# Patient Record
Sex: Male | Born: 1945 | Race: White | Hispanic: No | Marital: Married | Smoking: Former smoker
Health system: Southern US, Community
[De-identification: ages and names within clinical notes are randomized; demographics above are authoritative.]

## PROBLEM LIST (undated history)

## (undated) DIAGNOSIS — E119 Type 2 diabetes mellitus without complications: Secondary | ICD-10-CM

## (undated) DIAGNOSIS — E785 Hyperlipidemia, unspecified: Secondary | ICD-10-CM

## (undated) DIAGNOSIS — M109 Gout, unspecified: Secondary | ICD-10-CM

## (undated) DIAGNOSIS — R55 Syncope and collapse: Secondary | ICD-10-CM

## (undated) DIAGNOSIS — I10 Essential (primary) hypertension: Secondary | ICD-10-CM

---
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
## 2019-08-02 DIAGNOSIS — I10 Essential (primary) hypertension: Secondary | ICD-10-CM

## 2019-08-02 DIAGNOSIS — E785 Hyperlipidemia, unspecified: Secondary | ICD-10-CM | POA: Diagnosis not present

## 2019-08-02 DIAGNOSIS — S91011A Laceration without foreign body, right ankle, initial encounter: Secondary | ICD-10-CM

## 2019-08-02 DIAGNOSIS — N289 Disorder of kidney and ureter, unspecified: Secondary | ICD-10-CM

## 2019-08-02 DIAGNOSIS — S81812A Laceration without foreign body, left lower leg, initial encounter: Secondary | ICD-10-CM

## 2019-08-02 DIAGNOSIS — Z87891 Personal history of nicotine dependence: Secondary | ICD-10-CM

## 2019-08-02 DIAGNOSIS — R931 Abnormal findings on diagnostic imaging of heart and coronary circulation: Secondary | ICD-10-CM

## 2019-08-02 DIAGNOSIS — R9431 Abnormal electrocardiogram [ECG] [EKG]: Secondary | ICD-10-CM

## 2019-08-02 DIAGNOSIS — R634 Abnormal weight loss: Secondary | ICD-10-CM

## 2019-08-02 DIAGNOSIS — R55 Syncope and collapse: Secondary | ICD-10-CM

## 2019-08-02 DIAGNOSIS — R778 Other specified abnormalities of plasma proteins: Secondary | ICD-10-CM

## 2019-08-02 DIAGNOSIS — E119 Type 2 diabetes mellitus without complications: Secondary | ICD-10-CM | POA: Diagnosis not present

## 2019-08-03 ENCOUNTER — Encounter (HOSPITAL_COMMUNITY): Payer: Self-pay

## 2019-08-03 ENCOUNTER — Inpatient Hospital Stay (HOSPITAL_COMMUNITY): Payer: Medicare PPO

## 2019-08-03 ENCOUNTER — Other Ambulatory Visit: Payer: Self-pay

## 2019-08-03 ENCOUNTER — Inpatient Hospital Stay (HOSPITAL_COMMUNITY)
Admission: AD | Admit: 2019-08-03 | Discharge: 2019-08-12 | DRG: 233 | Disposition: A | Discharge status: Home / Self Care | Payer: Medicare PPO | Source: Other Acute Inpatient Hospital | Attending: Surgery | Admitting: Surgery

## 2019-08-03 ENCOUNTER — Encounter (HOSPITAL_COMMUNITY)
Admission: AD | Disposition: A | Discharge status: Home / Self Care | Payer: Self-pay | Source: Other Acute Inpatient Hospital | Attending: Surgery

## 2019-08-03 DIAGNOSIS — K219 Gastro-esophageal reflux disease without esophagitis: Secondary | ICD-10-CM | POA: Diagnosis present

## 2019-08-03 DIAGNOSIS — Y832 Surgical operation with anastomosis, bypass or graft as the cause of abnormal reaction of the patient, or of later complication, without mention of misadventure at the time of the procedure: Secondary | ICD-10-CM | POA: Diagnosis not present

## 2019-08-03 DIAGNOSIS — M109 Gout, unspecified: Secondary | ICD-10-CM | POA: Diagnosis present

## 2019-08-03 DIAGNOSIS — Z950 Presence of cardiac pacemaker: Secondary | ICD-10-CM

## 2019-08-03 DIAGNOSIS — H409 Unspecified glaucoma: Secondary | ICD-10-CM | POA: Diagnosis present

## 2019-08-03 DIAGNOSIS — I97190 Other postprocedural cardiac functional disturbances following cardiac surgery: Secondary | ICD-10-CM | POA: Diagnosis not present

## 2019-08-03 DIAGNOSIS — E1122 Type 2 diabetes mellitus with diabetic chronic kidney disease: Secondary | ICD-10-CM | POA: Diagnosis present

## 2019-08-03 DIAGNOSIS — I455 Other specified heart block: Secondary | ICD-10-CM | POA: Diagnosis not present

## 2019-08-03 DIAGNOSIS — N1832 Chronic kidney disease, stage 3b: Secondary | ICD-10-CM | POA: Diagnosis present

## 2019-08-03 DIAGNOSIS — E119 Type 2 diabetes mellitus without complications: Secondary | ICD-10-CM | POA: Diagnosis not present

## 2019-08-03 DIAGNOSIS — I469 Cardiac arrest, cause unspecified: Secondary | ICD-10-CM

## 2019-08-03 DIAGNOSIS — I129 Hypertensive chronic kidney disease with stage 1 through stage 4 chronic kidney disease, or unspecified chronic kidney disease: Secondary | ICD-10-CM | POA: Diagnosis present

## 2019-08-03 DIAGNOSIS — Q245 Malformation of coronary vessels: Secondary | ICD-10-CM | POA: Diagnosis not present

## 2019-08-03 DIAGNOSIS — I4901 Ventricular fibrillation: Secondary | ICD-10-CM | POA: Diagnosis not present

## 2019-08-03 DIAGNOSIS — I959 Hypotension, unspecified: Secondary | ICD-10-CM | POA: Diagnosis not present

## 2019-08-03 DIAGNOSIS — Z8249 Family history of ischemic heart disease and other diseases of the circulatory system: Secondary | ICD-10-CM

## 2019-08-03 DIAGNOSIS — Z09 Encounter for follow-up examination after completed treatment for conditions other than malignant neoplasm: Secondary | ICD-10-CM

## 2019-08-03 DIAGNOSIS — I2510 Atherosclerotic heart disease of native coronary artery without angina pectoris: Secondary | ICD-10-CM | POA: Diagnosis not present

## 2019-08-03 DIAGNOSIS — I480 Paroxysmal atrial fibrillation: Secondary | ICD-10-CM | POA: Diagnosis not present

## 2019-08-03 DIAGNOSIS — Z87891 Personal history of nicotine dependence: Secondary | ICD-10-CM | POA: Diagnosis not present

## 2019-08-03 DIAGNOSIS — I491 Atrial premature depolarization: Secondary | ICD-10-CM | POA: Diagnosis not present

## 2019-08-03 DIAGNOSIS — I462 Cardiac arrest due to underlying cardiac condition: Secondary | ICD-10-CM | POA: Diagnosis present

## 2019-08-03 DIAGNOSIS — D62 Acute posthemorrhagic anemia: Secondary | ICD-10-CM | POA: Diagnosis not present

## 2019-08-03 DIAGNOSIS — E785 Hyperlipidemia, unspecified: Secondary | ICD-10-CM | POA: Diagnosis present

## 2019-08-03 DIAGNOSIS — I252 Old myocardial infarction: Secondary | ICD-10-CM | POA: Diagnosis not present

## 2019-08-03 DIAGNOSIS — I495 Sick sinus syndrome: Secondary | ICD-10-CM | POA: Diagnosis not present

## 2019-08-03 DIAGNOSIS — R55 Syncope and collapse: Secondary | ICD-10-CM

## 2019-08-03 DIAGNOSIS — Z951 Presence of aortocoronary bypass graft: Secondary | ICD-10-CM | POA: Diagnosis not present

## 2019-08-03 DIAGNOSIS — I2119 ST elevation (STEMI) myocardial infarction involving other coronary artery of inferior wall: Secondary | ICD-10-CM | POA: Diagnosis not present

## 2019-08-03 DIAGNOSIS — I97710 Intraoperative cardiac arrest during cardiac surgery: Secondary | ICD-10-CM | POA: Diagnosis not present

## 2019-08-03 DIAGNOSIS — Z01810 Encounter for preprocedural cardiovascular examination: Secondary | ICD-10-CM | POA: Diagnosis not present

## 2019-08-03 DIAGNOSIS — I200 Unstable angina: Secondary | ICD-10-CM

## 2019-08-03 DIAGNOSIS — I10 Essential (primary) hypertension: Secondary | ICD-10-CM | POA: Diagnosis not present

## 2019-08-03 DIAGNOSIS — R634 Abnormal weight loss: Secondary | ICD-10-CM | POA: Diagnosis not present

## 2019-08-03 DIAGNOSIS — N289 Disorder of kidney and ureter, unspecified: Secondary | ICD-10-CM | POA: Diagnosis not present

## 2019-08-03 HISTORY — DX: Atherosclerotic heart disease of native coronary artery without angina pectoris: I25.10

## 2019-08-03 HISTORY — DX: Cardiac arrest, cause unspecified: I46.9

## 2019-08-03 HISTORY — DX: Type 2 diabetes mellitus without complications: E11.9

## 2019-08-03 HISTORY — DX: Hyperlipidemia, unspecified: E78.5

## 2019-08-03 HISTORY — PX: LEFT HEART CATH AND CORONARY ANGIOGRAPHY: CATH118249

## 2019-08-03 HISTORY — DX: Gout, unspecified: M10.9

## 2019-08-03 HISTORY — DX: Syncope and collapse: R55

## 2019-08-03 HISTORY — PX: TEMPORARY PACEMAKER: CATH118268

## 2019-08-03 HISTORY — DX: ST elevation (STEMI) myocardial infarction involving other coronary artery of inferior wall: I21.19

## 2019-08-03 HISTORY — DX: Essential (primary) hypertension: I10

## 2019-08-03 LAB — SURGICAL PCR SCREEN
MRSA, PCR: NEGATIVE
Staphylococcus aureus: NEGATIVE

## 2019-08-03 LAB — CBC
HCT: 28.3 % — ABNORMAL LOW (ref 39.0–52.0)
Hemoglobin: 9.7 g/dL — ABNORMAL LOW (ref 13.0–17.0)
MCH: 29.7 pg (ref 26.0–34.0)
MCHC: 34.3 g/dL (ref 30.0–36.0)
MCV: 86.5 fL (ref 80.0–100.0)
Platelets: 142 10*3/uL — ABNORMAL LOW (ref 150–400)
RBC: 3.27 MIL/uL — ABNORMAL LOW (ref 4.22–5.81)
RDW: 13.2 % (ref 11.5–15.5)
WBC: 7.3 10*3/uL (ref 4.0–10.5)
nRBC: 0 % (ref 0.0–0.2)

## 2019-08-03 LAB — CREATININE, SERUM
Creatinine, Ser: 1.82 mg/dL — ABNORMAL HIGH (ref 0.61–1.24)
GFR calc Af Amer: 42 mL/min — ABNORMAL LOW (ref >60)
GFR calc non Af Amer: 36 mL/min — ABNORMAL LOW (ref >60)

## 2019-08-03 LAB — ABO/RH: ABO/RH(D): A POS

## 2019-08-03 LAB — ECHOCARDIOGRAM COMPLETE
Height: 70 in
Weight: 2592 oz

## 2019-08-03 LAB — GLUCOSE, CAPILLARY
Glucose-Capillary: 110 mg/dL — ABNORMAL HIGH (ref 70–99)
Glucose-Capillary: 107 mg/dL — ABNORMAL HIGH (ref 70–99)
Glucose-Capillary: 127 mg/dL — ABNORMAL HIGH (ref 70–99)

## 2019-08-03 LAB — MRSA PCR SCREENING: MRSA by PCR: NEGATIVE

## 2019-08-03 SURGERY — LEFT HEART CATH AND CORONARY ANGIOGRAPHY
Anesthesia: LOCAL

## 2019-08-03 MED ORDER — SODIUM CHLORIDE 0.9 % IV SOLN
750.0000 mg | INTRAVENOUS | Status: DC
  Filled 2019-08-03: qty 750

## 2019-08-03 MED ORDER — DEXMEDETOMIDINE HCL IN NACL 400 MCG/100ML IV SOLN
0.1000 ug/kg/h | INTRAVENOUS | Status: AC
  Administered 2019-08-04: 0.7 ug/kg/h via INTRAVENOUS
  Filled 2019-08-03: qty 100

## 2019-08-03 MED ORDER — HEPARIN SODIUM (PORCINE) 1000 UNIT/ML IJ SOLN
INTRAMUSCULAR | Status: AC
  Filled 2019-08-03: qty 1

## 2019-08-03 MED ORDER — SODIUM CHLORIDE 0.9% FLUSH
3.0000 mL | Freq: Two times a day (BID) | INTRAVENOUS | Status: DC
  Administered 2019-08-03: 3 mL via INTRAVENOUS

## 2019-08-03 MED ORDER — OXYCODONE HCL 5 MG PO TABS
5.0000 mg | ORAL_TABLET | ORAL | Status: DC | PRN
  Administered 2019-08-03: 5 mg via ORAL
  Filled 2019-08-03: qty 1

## 2019-08-03 MED ORDER — LIDOCAINE HCL (PF) 1 % IJ SOLN
INTRAMUSCULAR | Status: DC | PRN
  Administered 2019-08-03: 15 mL
  Administered 2019-08-03: 2 mL

## 2019-08-03 MED ORDER — HEPARIN SODIUM (PORCINE) 1000 UNIT/ML IJ SOLN
INTRAMUSCULAR | Status: DC | PRN
  Administered 2019-08-03: 3500 [IU] via INTRAVENOUS

## 2019-08-03 MED ORDER — SODIUM CHLORIDE 0.9 % WEIGHT BASED INFUSION: 3.0000 mL/kg/h | INTRAVENOUS | Status: AC

## 2019-08-03 MED ORDER — LABETALOL HCL 5 MG/ML IV SOLN: 10.0000 mg | INTRAVENOUS | Status: DC | PRN

## 2019-08-03 MED ORDER — SODIUM CHLORIDE 0.9% FLUSH: 3.0000 mL | Freq: Two times a day (BID) | INTRAVENOUS | Status: DC

## 2019-08-03 MED ORDER — VERAPAMIL HCL 2.5 MG/ML IV SOLN
INTRAVENOUS | Status: DC | PRN
  Administered 2019-08-03: 10 mL via INTRA_ARTERIAL

## 2019-08-03 MED ORDER — HEPARIN (PORCINE) IN NACL 1000-0.9 UT/500ML-% IV SOLN
INTRAVENOUS | Status: DC | PRN
  Administered 2019-08-03 (×3): 500 mL

## 2019-08-03 MED ORDER — ACETAMINOPHEN 325 MG PO TABS: 650.0000 mg | ORAL_TABLET | ORAL | Status: DC | PRN

## 2019-08-03 MED ORDER — PLASMA-LYTE 148 IV SOLN
INTRAVENOUS | Status: AC
  Administered 2019-08-04: 500 mL
  Filled 2019-08-03: qty 2.5

## 2019-08-03 MED ORDER — POTASSIUM CHLORIDE 2 MEQ/ML IV SOLN
80.0000 meq | INTRAVENOUS | Status: DC
  Filled 2019-08-03: qty 40

## 2019-08-03 MED ORDER — HYDRALAZINE HCL 20 MG/ML IJ SOLN: 10.0000 mg | INTRAMUSCULAR | Status: AC | PRN

## 2019-08-03 MED ORDER — SODIUM CHLORIDE 0.9 % IV SOLN
INTRAVENOUS | Status: AC | PRN
  Administered 2019-08-03: 10 mL/h via INTRAVENOUS

## 2019-08-03 MED ORDER — SODIUM CHLORIDE 0.9 % IV SOLN
INTRAVENOUS | Status: DC | PRN
  Administered 2019-08-03: 10 mL/h via INTRAVENOUS

## 2019-08-03 MED ORDER — TRANEXAMIC ACID (OHS) BOLUS VIA INFUSION
15.0000 mg/kg | INTRAVENOUS | Status: AC
  Administered 2019-08-04: 1102.5 mg via INTRAVENOUS
  Filled 2019-08-03: qty 1103

## 2019-08-03 MED ORDER — NITROGLYCERIN IN D5W 200-5 MCG/ML-% IV SOLN
2.0000 ug/min | INTRAVENOUS | Status: DC
  Filled 2019-08-03: qty 250

## 2019-08-03 MED ORDER — MAGNESIUM SULFATE 50 % IJ SOLN
40.0000 meq | INTRAMUSCULAR | Status: DC
  Filled 2019-08-03 (×2): qty 9.85

## 2019-08-03 MED ORDER — PHENYLEPHRINE HCL-NACL 20-0.9 MG/250ML-% IV SOLN
30.0000 ug/min | INTRAVENOUS | Status: AC
  Administered 2019-08-04: 25 ug/min via INTRAVENOUS
  Filled 2019-08-03: qty 250

## 2019-08-03 MED ORDER — VERAPAMIL HCL 2.5 MG/ML IV SOLN
INTRAVENOUS | Status: AC
  Filled 2019-08-03: qty 2

## 2019-08-03 MED ORDER — SODIUM CHLORIDE 0.9 % IV SOLN
1.5000 g | INTRAVENOUS | Status: AC
  Administered 2019-08-04: 09:00:00 1.5 g via INTRAVENOUS
  Administered 2019-08-04: .75 g via INTRAVENOUS
  Filled 2019-08-03: qty 1.5

## 2019-08-03 MED ORDER — LIDOCAINE HCL (PF) 1 % IJ SOLN
INTRAMUSCULAR | Status: AC
  Filled 2019-08-03: qty 30

## 2019-08-03 MED ORDER — CHLORHEXIDINE GLUCONATE CLOTH 2 % EX PADS
6.0000 | MEDICATED_PAD | Freq: Once | CUTANEOUS | Status: AC
  Administered 2019-08-04: 6 via TOPICAL

## 2019-08-03 MED ORDER — SODIUM CHLORIDE 0.9 % IV SOLN: 250.0000 mL | INTRAVENOUS | Status: DC | PRN

## 2019-08-03 MED ORDER — SODIUM CHLORIDE 0.9% FLUSH: 3.0000 mL | INTRAVENOUS | Status: DC | PRN

## 2019-08-03 MED ORDER — CHLORHEXIDINE GLUCONATE 0.12 % MT SOLN
15.0000 mL | Freq: Once | OROMUCOSAL | Status: AC
  Administered 2019-08-04: 15 mL via OROMUCOSAL
  Filled 2019-08-03: qty 15

## 2019-08-03 MED ORDER — DOPAMINE-DEXTROSE 3.2-5 MG/ML-% IV SOLN
0.0000 ug/kg/min | INTRAVENOUS | Status: DC
  Filled 2019-08-03: qty 250

## 2019-08-03 MED ORDER — INSULIN REGULAR(HUMAN) IN NACL 100-0.9 UT/100ML-% IV SOLN
INTRAVENOUS | Status: AC
  Administered 2019-08-04: 1 [IU]/h via INTRAVENOUS
  Filled 2019-08-03: qty 100

## 2019-08-03 MED ORDER — HEPARIN (PORCINE) 25000 UT/250ML-% IV SOLN
900.0000 [IU]/h | INTRAVENOUS | Status: DC
  Administered 2019-08-03: 900 [IU]/h via INTRAVENOUS

## 2019-08-03 MED ORDER — SODIUM CHLORIDE 0.9 % WEIGHT BASED INFUSION: 1.0000 mL/kg/h | INTRAVENOUS | Status: DC

## 2019-08-03 MED ORDER — HEPARIN (PORCINE) IN NACL 1000-0.9 UT/500ML-% IV SOLN
INTRAVENOUS | Status: AC
  Filled 2019-08-03: qty 1000

## 2019-08-03 MED ORDER — SODIUM CHLORIDE 0.9 % IV SOLN
250.0000 mL | INTRAVENOUS | Status: DC | PRN
  Administered 2019-08-03: 250 mL via INTRAVENOUS

## 2019-08-03 MED ORDER — ASPIRIN 81 MG PO CHEW
81.0000 mg | CHEWABLE_TABLET | ORAL | Status: AC
  Administered 2019-08-03: 81 mg via ORAL

## 2019-08-03 MED ORDER — CHLORHEXIDINE GLUCONATE CLOTH 2 % EX PADS
6.0000 | MEDICATED_PAD | Freq: Once | CUTANEOUS | Status: AC
  Administered 2019-08-03: 6 via TOPICAL

## 2019-08-03 MED ORDER — DIAZEPAM 5 MG PO TABS
5.0000 mg | ORAL_TABLET | Freq: Once | ORAL | Status: AC
  Administered 2019-08-04: 5 mg via ORAL
  Filled 2019-08-03: qty 1

## 2019-08-03 MED ORDER — TRAZODONE HCL 50 MG PO TABS
50.0000 mg | ORAL_TABLET | Freq: Every day | ORAL | Status: DC
  Administered 2019-08-03: 50 mg via ORAL
  Filled 2019-08-03: qty 1

## 2019-08-03 MED ORDER — CHLORHEXIDINE GLUCONATE CLOTH 2 % EX PADS
6.0000 | MEDICATED_PAD | Freq: Every day | CUTANEOUS | Status: DC
  Administered 2019-08-03: 6 via TOPICAL

## 2019-08-03 MED ORDER — SODIUM CHLORIDE 0.9 % IV SOLN
INTRAVENOUS | Status: DC
  Filled 2019-08-03: qty 30

## 2019-08-03 MED ORDER — TRANEXAMIC ACID (OHS) PUMP PRIME SOLUTION
2.0000 mg/kg | INTRAVENOUS | Status: DC
  Filled 2019-08-03: qty 1.47

## 2019-08-03 MED ORDER — SODIUM CHLORIDE 0.9 % WEIGHT BASED INFUSION
1.0000 mL/kg/h | INTRAVENOUS | Status: DC
  Administered 2019-08-03 (×2): 1 mL/kg/h via INTRAVENOUS

## 2019-08-03 MED ORDER — ONDANSETRON HCL 4 MG/2ML IJ SOLN: 4.0000 mg | Freq: Four times a day (QID) | INTRAMUSCULAR | Status: DC | PRN

## 2019-08-03 MED ORDER — HEPARIN SODIUM (PORCINE) 5000 UNIT/ML IJ SOLN: 5000.0000 [IU] | Freq: Three times a day (TID) | INTRAMUSCULAR | Status: DC

## 2019-08-03 MED ORDER — ASPIRIN 81 MG PO CHEW
81.0000 mg | CHEWABLE_TABLET | Freq: Every day | ORAL | Status: DC
  Filled 2019-08-03: qty 1

## 2019-08-03 MED ORDER — TRANEXAMIC ACID 1000 MG/10ML IV SOLN
1.5000 mg/kg/h | INTRAVENOUS | Status: AC
  Administered 2019-08-04: 1.5 mg/kg/h via INTRAVENOUS
  Filled 2019-08-03: qty 25

## 2019-08-03 MED ORDER — VANCOMYCIN HCL 10 G IV SOLR
1250.0000 mg | INTRAVENOUS | Status: AC
  Administered 2019-08-04: 1250 mg via INTRAVENOUS
  Filled 2019-08-03: qty 1250

## 2019-08-03 MED ORDER — BISACODYL 5 MG PO TBEC
5.0000 mg | DELAYED_RELEASE_TABLET | Freq: Once | ORAL | Status: AC
  Administered 2019-08-03: 5 mg via ORAL
  Filled 2019-08-03: qty 1

## 2019-08-03 MED ORDER — TEMAZEPAM 15 MG PO CAPS: 15.0000 mg | ORAL_CAPSULE | Freq: Once | ORAL | Status: DC | PRN

## 2019-08-03 MED ORDER — MILRINONE LACTATE IN DEXTROSE 20-5 MG/100ML-% IV SOLN
0.3000 ug/kg/min | INTRAVENOUS | Status: DC
  Filled 2019-08-03: qty 100

## 2019-08-03 MED ORDER — EPINEPHRINE HCL 5 MG/250ML IV SOLN IN NS
0.0000 ug/min | INTRAVENOUS | Status: DC
  Filled 2019-08-03: qty 250

## 2019-08-03 SURGICAL SUPPLY — 16 items
CABLE ADAPT PACING TEMP 12FT (ADAPTER) ×2 IMPLANT
CATH 5FR JL3.5 JR4 ANG PIG MP (CATHETERS) ×2 IMPLANT
CATH INFINITI 5FR AL1 (CATHETERS) ×2 IMPLANT
CATH S G BIP PACING (CATHETERS) ×2 IMPLANT
DEVICE RAD COMP TR BAND LRG (VASCULAR PRODUCTS) ×2 IMPLANT
GLIDESHEATH SLEND A-KIT 6F 22G (SHEATH) ×2 IMPLANT
GLIDESHEATH SLEND SS 6F .021 (SHEATH) IMPLANT
GUIDEWIRE INQWIRE 1.5J.035X260 (WIRE) ×1 IMPLANT
INQWIRE 1.5J .035X260CM (WIRE) ×2
KIT HEART LEFT (KITS) ×2 IMPLANT
PACK CARDIAC CATHETERIZATION (CUSTOM PROCEDURE TRAY) ×2 IMPLANT
SHEATH PINNACLE 6F 10CM (SHEATH) ×2 IMPLANT
SHEATH PROBE COVER 6X72 (BAG) ×2 IMPLANT
SLEEVE REPOSITIONING LENGTH 30 (MISCELLANEOUS) ×2 IMPLANT
TRANSDUCER W/STOPCOCK (MISCELLANEOUS) ×2 IMPLANT
TUBING CIL FLEX 10 FLL-RA (TUBING) ×2 IMPLANT
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
## 2019-08-03 NOTE — Progress Notes (Signed)
Pre-CABG Dopplers completed. Refer to "CV Proc" under chart review to view preliminary results.  08/03/2019 5:00 PM Maudry Mayhew, MHA, RVT, RDCS, RDMS
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
## 2019-08-03 NOTE — CV Procedure (Signed)
   Left heart cath , coronary angiography, and LV from right radial. Temporary transvenous pacemaker via right femoral vein. Access obtained with real time vascular US.  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  Anomalous origin of the circumflex from the right sinus of Valsalva.  Circumflex is small and essentially absent.  Right coronary is totally occluded in the mid segment and fills late by collaterals from the left coronary artery.  Left main is widely patent.  The proximal LAD is severely stenosed as is a relatively small caliber mid and distal LAD.  A large ramus branch contains proximal to mid 60 to 70% stenosis.  The circumflex distribution is supplied seen best in the spider view has ostial to proximal 90% stenosis.  2 obtuse marginal branches are relatively small and contains diffuse 50% narrowing for supplying the lateral wall.  The more distal branch is large.  The right coronary is totally occluded and fills left to right by collaterals.  There are 3 ostia in the right sinus of Valsalva.  The ostia arising from the traditional site of the right coronary demonstrates total occlusion of the right coronary proximal segment and the remaining branch proximal to the occlusion appears to take a posterior course around to the left inferolateral wall supplying what appears to be a vestigial circumflex component.  The second ostia from the right sinus of Valsalva appears to supply an RV branch and septal perforators.  The third ostium is the left main.  Temporary pacemaker and RV apex placed because of episodes of sinus arrest and asystole.  Recommend TCTS evaluation for surgical revascularization.

## 2019-08-03 NOTE — Progress Notes (Signed)
  Echocardiogram 2D Echocardiogram has been performed.  Paul Torres 08/03/2019, 5:18 PM
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
## 2019-08-03 NOTE — Interval H&P Note (Signed)
Cath Lab Visit (complete for each Cath Lab visit)  Clinical Evaluation Leading to the Procedure:   ACS: No.  Non-ACS:    Anginal Classification: CCS III  Anti-ischemic medical therapy: No Therapy  Non-Invasive Test Results: No non-invasive testing performed  Prior CABG: No previous CABG      History and Physical Interval Note:  08/03/2019 12:23 PM  Paul Torres  has presented today for surgery, with the diagnosis of chest pain.  The various methods of treatment have been discussed with the patient and family. After consideration of risks, benefits and other options for treatment, the patient has consented to  Procedure(s): LEFT HEART CATH AND CORONARY ANGIOGRAPHY (N/A) TEMPORARY PACEMAKER (N/A) as a surgical intervention.  The patient's history has been reviewed, patient examined, no change in status, stable for surgery.  I have reviewed the patient's chart and labs.  Questions were answered to the patient's satisfaction.     Belva Crome III

## 2019-08-03 NOTE — Progress Notes (Addendum)
ANTICOAGULATION CONSULT NOTE - Initial Consult  Pharmacy Consult for heparin Indication: chest pain/ACS  No Known Allergies  Patient Measurements: Height: 5\' 10"  (177.8 cm) Weight: 162 lb (73.5 kg) IBW/kg (Calculated) : 73 Heparin Dosing Weight: 73kg  Vital Signs: Temp: 98.1 F (36.7 C) (10/28 1609) Temp Source: Oral (10/28 1609) BP: 140/66 (10/28 1324) Pulse Rate: 73 (10/28 1421)  Labs: Recent Labs    08/03/19 1503  HGB 9.7*  HCT 28.3*  PLT 142*  CREATININE 1.82*    Estimated Creatinine Clearance: 37.3 mL/min (A) (by C-G formula based on SCr of 1.82 mg/dL (H)).   Medical History: Past Medical History:  Diagnosis Date  . Diabetes mellitus without complication ([place])   . Gout   . Hyperlipemia 08/03/2019  . Hypertension   . Syncope     Assessment: 73 year old male admitted post cath with multivessel disease transferred to ICU with temp pacer.   Orders to start heparin tonight, surgery tentatively planned for tomorrow.   Goal of Therapy:  Heparin level 0.3-0.7 units/ml Monitor platelets by anticoagulation protocol: Yes   Plan:  Start heparin infusion at 900 units/hr Heparin level in am Continue to monitor H&H and platelets  Erin Hearing PharmD., BCPS Clinical Pharmacist 08/03/2019 4:30 PM

## 2019-08-03 NOTE — Progress Notes (Signed)
    Called to CVICU for Code Blue -- pt had prolonged ~30-40 sec episode of Aystole the occurred during blood draw from sheath.  10 chest compressions performed & Temp Wire MA increased to max , back-up pace rate set @ 80 bpm.   Upon my arrival - was in intermittent SR with intermittent pacing.   Tested back up rhythm with reducing pacing rate - SR noted with rate in 60s-70s.    Will leave rate @ 80 for now to ensure adequate HR & capture.  Pt seen by CVTS team during this event.    Currently in SR - 80-90 with back-up pacing.   Zoll pads in place for back-up, in case TPM not capturing.  Minimize movement to avoid dislodging TPM lead.   Plan if for CABG with Dr.Bartle in AM.   Echo still pending -> if EF is down, per D/c EP - recommend LV epicardial lead in OR.    Glenetta Hew, MD

## 2019-08-03 NOTE — CV Procedure (Signed)
TTE Report  Unable to enter formal report at this time as the study is opened by the sonographer and I cannot reach her.  Echo reveals:  LVEF 45-50% Hypokinesis of the anterolateral and mid to apical inferolateral myocardium Hypokinesis of the apical and inferoapical myocardium Grade 1 diastolic dysfunction Severe LA enlargement Trivial MR Trivial TR RA pressure 3 mmHg  Full report to follow tomorrow.  Xuan Mateus C. Oval Linsey, MD, [facility] 08/03/2019 7:06 PM

## 2019-08-03 NOTE — Progress Notes (Signed)
  EP aware and following. Discussed case with Dr. Ellyn Hack and Dr. Curt Bears earlier.   Pt from [facility] with syncope and chest pain. Cath today by Dr. Tamala Julian significant for: [facility] 08/03/19 - Circumflex small and "essentiall absent" - RCA totally occluded in the mid segment with late segment filling by collaterals from LCA - Left main widely patent - Proximal LAD with severe stenosis as is a relatively small caliber mid and distal LAD - A large ramus branch contains proximal to mid 60 to 70% stenosis. - The circumflex distribution is supplied seen best in the spider view has ostial to proximal 90% stenosis.  2 obtuse marginal branches are relatively small and contains diffuse 50% narrowing for supplying the lateral wall.  The more distal branch is large.  - Temp pacer placed at the RV apex due to episodes of sinus arrest and asystole during case.   Pt has been referred to TCTS for evaluation for CABG.  Our recommendation is that pt have Epicardial LV wire placed during CABG IF Ejection Fraction low.   Otherwise, please call EP back post op if conduction does not improve with re-vascularization.   Legrand Como [address]" [place], PA-C  08/03/2019 2:48 PM

## 2019-08-03 NOTE — Progress Notes (Signed)
TCTS consulted for CABG evaluation. °

## 2019-08-03 NOTE — Progress Notes (Signed)
This chaplain responded to consult for Pt. prayer.  The chaplain checked in with the Pt. RN-Erin before the visit. The Pt. family has gone home. The Pt. was sleeping at the time of the chaplain's visit. F/U spiritual care is available as needed.

## 2019-08-04 ENCOUNTER — Other Ambulatory Visit: Payer: Self-pay

## 2019-08-04 ENCOUNTER — Inpatient Hospital Stay (HOSPITAL_COMMUNITY): Payer: Medicare PPO

## 2019-08-04 ENCOUNTER — Inpatient Hospital Stay (HOSPITAL_COMMUNITY)
Admission: AD | Disposition: A | Discharge status: Home / Self Care | Payer: Self-pay | Source: Other Acute Inpatient Hospital | Attending: Surgery

## 2019-08-04 ENCOUNTER — Encounter (HOSPITAL_COMMUNITY): Payer: Self-pay

## 2019-08-04 ENCOUNTER — Inpatient Hospital Stay (HOSPITAL_COMMUNITY): Payer: Medicare PPO | Admitting: Anesthesiology

## 2019-08-04 DIAGNOSIS — Z951 Presence of aortocoronary bypass graft: Secondary | ICD-10-CM

## 2019-08-04 DIAGNOSIS — I2510 Atherosclerotic heart disease of native coronary artery without angina pectoris: Secondary | ICD-10-CM

## 2019-08-04 HISTORY — DX: Presence of aortocoronary bypass graft: Z95.1

## 2019-08-04 HISTORY — PX: TEE WITHOUT CARDIOVERSION: SHX5443

## 2019-08-04 HISTORY — PX: CORONARY ARTERY BYPASS GRAFT: SHX141

## 2019-08-04 LAB — APTT
aPTT: 44 seconds — ABNORMAL HIGH (ref 24–36)
aPTT: 32 seconds (ref 24–36)

## 2019-08-04 LAB — GLUCOSE, CAPILLARY
Glucose-Capillary: 125 mg/dL — ABNORMAL HIGH (ref 70–99)
Glucose-Capillary: 114 mg/dL — ABNORMAL HIGH (ref 70–99)
Glucose-Capillary: 106 mg/dL — ABNORMAL HIGH (ref 70–99)
Glucose-Capillary: 102 mg/dL — ABNORMAL HIGH (ref 70–99)
Glucose-Capillary: 103 mg/dL — ABNORMAL HIGH (ref 70–99)
Glucose-Capillary: 101 mg/dL — ABNORMAL HIGH (ref 70–99)
Glucose-Capillary: 106 mg/dL — ABNORMAL HIGH (ref 70–99)
Glucose-Capillary: 152 mg/dL — ABNORMAL HIGH (ref 70–99)
Glucose-Capillary: 144 mg/dL — ABNORMAL HIGH (ref 70–99)

## 2019-08-04 LAB — POCT I-STAT 7, (LYTES, BLD GAS, ICA,H+H)
Acid-base deficit: 3 mmol/L — ABNORMAL HIGH (ref 0.0–2.0)
Acid-base deficit: 2 mmol/L (ref 0.0–2.0)
Acid-base deficit: 4 mmol/L — ABNORMAL HIGH (ref 0.0–2.0)
Acid-base deficit: 4 mmol/L — ABNORMAL HIGH (ref 0.0–2.0)
Bicarbonate: 22.1 mmol/L (ref 20.0–28.0)
Bicarbonate: 22.5 mmol/L (ref 20.0–28.0)
Bicarbonate: 21.7 mmol/L (ref 20.0–28.0)
Bicarbonate: 21.9 mmol/L (ref 20.0–28.0)
Calcium, Ion: 1.22 mmol/L (ref 1.15–1.40)
Calcium, Ion: 1.14 mmol/L — ABNORMAL LOW (ref 1.15–1.40)
Calcium, Ion: 1.12 mmol/L — ABNORMAL LOW (ref 1.15–1.40)
Calcium, Ion: 1.05 mmol/L — ABNORMAL LOW (ref 1.15–1.40)
HCT: 36 % — ABNORMAL LOW (ref 39.0–52.0)
HCT: 28 % — ABNORMAL LOW (ref 39.0–52.0)
HCT: 29 % — ABNORMAL LOW (ref 39.0–52.0)
HCT: 31 % — ABNORMAL LOW (ref 39.0–52.0)
Hemoglobin: 12.2 g/dL — ABNORMAL LOW (ref 13.0–17.0)
Hemoglobin: 9.5 g/dL — ABNORMAL LOW (ref 13.0–17.0)
Hemoglobin: 9.9 g/dL — ABNORMAL LOW (ref 13.0–17.0)
Hemoglobin: 10.5 g/dL — ABNORMAL LOW (ref 13.0–17.0)
O2 Saturation: 89 %
O2 Saturation: 99 %
O2 Saturation: 99 %
O2 Saturation: 99 %
Patient temperature: 98
Patient temperature: 36.9
Patient temperature: 37.6
Patient temperature: 38
Potassium: 3.6 mmol/L (ref 3.5–5.1)
Potassium: 4.2 mmol/L (ref 3.5–5.1)
Potassium: 4.1 mmol/L (ref 3.5–5.1)
Potassium: 4.5 mmol/L (ref 3.5–5.1)
Sodium: 140 mmol/L (ref 135–145)
Sodium: 142 mmol/L (ref 135–145)
Sodium: 143 mmol/L (ref 135–145)
Sodium: 146 mmol/L — ABNORMAL HIGH (ref 135–145)
TCO2: 23 mmol/L (ref 22–32)
TCO2: 24 mmol/L (ref 22–32)
TCO2: 23 mmol/L (ref 22–32)
TCO2: 23 mmol/L (ref 22–32)
pCO2 arterial: 39.1 mmHg (ref 32.0–48.0)
pCO2 arterial: 35.7 mmHg (ref 32.0–48.0)
pCO2 arterial: 41.8 mmHg (ref 32.0–48.0)
pCO2 arterial: 42.7 mmHg (ref 32.0–48.0)
pH, Arterial: 7.358 (ref 7.350–7.450)
pH, Arterial: 7.407 (ref 7.350–7.450)
pH, Arterial: 7.326 — ABNORMAL LOW (ref 7.350–7.450)
pH, Arterial: 7.323 — ABNORMAL LOW (ref 7.350–7.450)
pO2, Arterial: 58 mmHg — ABNORMAL LOW (ref 83.0–108.0)
pO2, Arterial: 119 mmHg — ABNORMAL HIGH (ref 83.0–108.0)
pO2, Arterial: 134 mmHg — ABNORMAL HIGH (ref 83.0–108.0)
pO2, Arterial: 136 mmHg — ABNORMAL HIGH (ref 83.0–108.0)

## 2019-08-04 LAB — CBC
HCT: 28 % — ABNORMAL LOW (ref 39.0–52.0)
HCT: 30.6 % — ABNORMAL LOW (ref 39.0–52.0)
HCT: 32.7 % — ABNORMAL LOW (ref 39.0–52.0)
Hemoglobin: 9.5 g/dL — ABNORMAL LOW (ref 13.0–17.0)
Hemoglobin: 10.8 g/dL — ABNORMAL LOW (ref 13.0–17.0)
Hemoglobin: 11.3 g/dL — ABNORMAL LOW (ref 13.0–17.0)
MCH: 30.2 pg (ref 26.0–34.0)
MCH: 30.7 pg (ref 26.0–34.0)
MCH: 30.4 pg (ref 26.0–34.0)
MCHC: 33.9 g/dL (ref 30.0–36.0)
MCHC: 35.3 g/dL (ref 30.0–36.0)
MCHC: 34.6 g/dL (ref 30.0–36.0)
MCV: 88.9 fL (ref 80.0–100.0)
MCV: 86.9 fL (ref 80.0–100.0)
MCV: 87.9 fL (ref 80.0–100.0)
Platelets: 147 10*3/uL — ABNORMAL LOW (ref 150–400)
Platelets: 101 10*3/uL — ABNORMAL LOW (ref 150–400)
Platelets: 120 10*3/uL — ABNORMAL LOW (ref 150–400)
RBC: 3.15 MIL/uL — ABNORMAL LOW (ref 4.22–5.81)
RBC: 3.52 MIL/uL — ABNORMAL LOW (ref 4.22–5.81)
RBC: 3.72 MIL/uL — ABNORMAL LOW (ref 4.22–5.81)
RDW: 13.2 % (ref 11.5–15.5)
RDW: 13.6 % (ref 11.5–15.5)
RDW: 13.9 % (ref 11.5–15.5)
WBC: 7.7 10*3/uL (ref 4.0–10.5)
WBC: 10.8 10*3/uL — ABNORMAL HIGH (ref 4.0–10.5)
WBC: 10.7 10*3/uL — ABNORMAL HIGH (ref 4.0–10.5)
nRBC: 0 % (ref 0.0–0.2)
nRBC: 0 % (ref 0.0–0.2)
nRBC: 0 % (ref 0.0–0.2)

## 2019-08-04 LAB — URINALYSIS, ROUTINE W REFLEX MICROSCOPIC
Bilirubin Urine: NEGATIVE
Glucose, UA: 150 mg/dL — AB
Ketones, ur: 5 mg/dL — AB
Leukocytes,Ua: NEGATIVE
Nitrite: NEGATIVE
Protein, ur: NEGATIVE mg/dL
Specific Gravity, Urine: 1.024 (ref 1.005–1.030)
pH: 5 (ref 5.0–8.0)

## 2019-08-04 LAB — BASIC METABOLIC PANEL
Anion gap: 8 (ref 5–15)
Anion gap: 8 (ref 5–15)
BUN: 12 mg/dL (ref 8–23)
BUN: 10 mg/dL (ref 8–23)
CO2: 24 mmol/L (ref 22–32)
CO2: 21 mmol/L — ABNORMAL LOW (ref 22–32)
Calcium: 8.1 mg/dL — ABNORMAL LOW (ref 8.9–10.3)
Calcium: 7.6 mg/dL — ABNORMAL LOW (ref 8.9–10.3)
Chloride: 107 mmol/L (ref 98–111)
Chloride: 112 mmol/L — ABNORMAL HIGH (ref 98–111)
Creatinine, Ser: 1.57 mg/dL — ABNORMAL HIGH (ref 0.61–1.24)
Creatinine, Ser: 1.59 mg/dL — ABNORMAL HIGH (ref 0.61–1.24)
GFR calc Af Amer: 50 mL/min — ABNORMAL LOW (ref >60)
GFR calc Af Amer: 49 mL/min — ABNORMAL LOW (ref >60)
GFR calc non Af Amer: 43 mL/min — ABNORMAL LOW (ref >60)
GFR calc non Af Amer: 42 mL/min — ABNORMAL LOW (ref >60)
Glucose, Bld: 134 mg/dL — ABNORMAL HIGH (ref 70–99)
Glucose, Bld: 127 mg/dL — ABNORMAL HIGH (ref 70–99)
Potassium: 3.7 mmol/L (ref 3.5–5.1)
Potassium: 4.1 mmol/L (ref 3.5–5.1)
Sodium: 139 mmol/L (ref 135–145)
Sodium: 141 mmol/L (ref 135–145)

## 2019-08-04 LAB — PLATELET COUNT: Platelets: 110 10*3/uL — ABNORMAL LOW (ref 150–400)

## 2019-08-04 LAB — MAGNESIUM: Magnesium: 2.7 mg/dL — ABNORMAL HIGH (ref 1.7–2.4)

## 2019-08-04 LAB — HEMOGLOBIN A1C
Hgb A1c MFr Bld: 5.6 % (ref 4.8–5.6)
Mean Plasma Glucose: 114.02 mg/dL

## 2019-08-04 LAB — HEMOGLOBIN AND HEMATOCRIT, BLOOD
HCT: 29.2 % — ABNORMAL LOW (ref 39.0–52.0)
Hemoglobin: 10.2 g/dL — ABNORMAL LOW (ref 13.0–17.0)

## 2019-08-04 LAB — ECHO INTRAOPERATIVE TEE
Height: 70 in
Weight: 2592 oz

## 2019-08-04 LAB — LIPID PANEL
Cholesterol: 72 mg/dL (ref 0–200)
HDL: 26 mg/dL — ABNORMAL LOW (ref >40)
LDL Cholesterol: 32 mg/dL (ref 0–99)
Total CHOL/HDL Ratio: 2.8 RATIO
Triglycerides: 69 mg/dL (ref <150)
VLDL: 14 mg/dL (ref 0–40)

## 2019-08-04 LAB — PREPARE RBC (CROSSMATCH)

## 2019-08-04 LAB — HEPARIN LEVEL (UNFRACTIONATED): Heparin Unfractionated: <0.1 [IU]/mL — ABNORMAL LOW (ref 0.30–0.70)

## 2019-08-04 LAB — PROTIME-INR
INR: 1.5 — ABNORMAL HIGH (ref 0.8–1.2)
Prothrombin Time: 17.5 seconds — ABNORMAL HIGH (ref 11.4–15.2)

## 2019-08-04 SURGERY — CORONARY ARTERY BYPASS GRAFTING (CABG)
Anesthesia: General | Site: Chest

## 2019-08-04 MED ORDER — BISACODYL 5 MG PO TBEC
10.0000 mg | DELAYED_RELEASE_TABLET | Freq: Every day | ORAL | Status: DC
  Administered 2019-08-05 – 2019-08-07 (×3): 10 mg via ORAL
  Filled 2019-08-04 (×3): qty 2

## 2019-08-04 MED ORDER — PROTAMINE SULFATE 10 MG/ML IV SOLN
INTRAVENOUS | Status: DC | PRN
  Administered 2019-08-04: 50 mg via INTRAVENOUS
  Administered 2019-08-04: 30 mg via INTRAVENOUS
  Administered 2019-08-04: 50 mg via INTRAVENOUS
  Administered 2019-08-04: 20 mg via INTRAVENOUS
  Administered 2019-08-04 (×2): 50 mg via INTRAVENOUS

## 2019-08-04 MED ORDER — METOPROLOL TARTRATE 25 MG/10 ML ORAL SUSPENSION: 12.5000 mg | Freq: Two times a day (BID) | ORAL | Status: DC

## 2019-08-04 MED ORDER — ORAL CARE MOUTH RINSE
15.0000 mL | Freq: Two times a day (BID) | OROMUCOSAL | Status: DC
  Administered 2019-08-05 – 2019-08-07 (×5): 15 mL via OROMUCOSAL

## 2019-08-04 MED ORDER — LACTATED RINGERS IV SOLN: INTRAVENOUS | Status: DC

## 2019-08-04 MED ORDER — OXYCODONE HCL 5 MG PO TABS
5.0000 mg | ORAL_TABLET | ORAL | Status: DC | PRN
  Administered 2019-08-06: 5 mg via ORAL
  Filled 2019-08-04 (×2): qty 1

## 2019-08-04 MED ORDER — LACTATED RINGERS IV SOLN
INTRAVENOUS | Status: DC | PRN
  Administered 2019-08-04: 09:00:00 via INTRAVENOUS

## 2019-08-04 MED ORDER — HEPARIN SODIUM (PORCINE) 1000 UNIT/ML IJ SOLN
INTRAMUSCULAR | Status: AC
  Filled 2019-08-04: qty 1

## 2019-08-04 MED ORDER — MIDAZOLAM HCL 5 MG/5ML IJ SOLN
INTRAMUSCULAR | Status: DC | PRN
  Administered 2019-08-04: 2 mg via INTRAVENOUS
  Administered 2019-08-04: 1 mg via INTRAVENOUS
  Administered 2019-08-04: 2 mg via INTRAVENOUS

## 2019-08-04 MED ORDER — FENTANYL CITRATE (PF) 250 MCG/5ML IJ SOLN
INTRAMUSCULAR | Status: DC | PRN
  Administered 2019-08-04: 100 ug via INTRAVENOUS
  Administered 2019-08-04: 50 ug via INTRAVENOUS
  Administered 2019-08-04: 100 ug via INTRAVENOUS
  Administered 2019-08-04: 250 ug via INTRAVENOUS
  Administered 2019-08-04: 150 ug via INTRAVENOUS
  Administered 2019-08-04 (×2): 50 ug via INTRAVENOUS
  Administered 2019-08-04: 100 ug via INTRAVENOUS
  Administered 2019-08-04: 250 ug via INTRAVENOUS

## 2019-08-04 MED ORDER — MIDAZOLAM HCL (PF) 10 MG/2ML IJ SOLN
INTRAMUSCULAR | Status: AC
  Filled 2019-08-04: qty 2

## 2019-08-04 MED ORDER — ALBUMIN HUMAN 5 % IV SOLN
INTRAVENOUS | Status: DC | PRN
  Administered 2019-08-04: 13:00:00 via INTRAVENOUS

## 2019-08-04 MED ORDER — ORAL CARE MOUTH RINSE
15.0000 mL | OROMUCOSAL | Status: DC
  Administered 2019-08-04 (×2): 15 mL via OROMUCOSAL

## 2019-08-04 MED ORDER — SODIUM CHLORIDE 0.9% FLUSH: 10.0000 mL | INTRAVENOUS | Status: DC | PRN

## 2019-08-04 MED ORDER — MORPHINE SULFATE (PF) 2 MG/ML IV SOLN
1.0000 mg | INTRAVENOUS | Status: DC | PRN
  Administered 2019-08-04 – 2019-08-05 (×3): 2 mg via INTRAVENOUS
  Administered 2019-08-05: 1 mg via INTRAVENOUS
  Administered 2019-08-05: 2 mg via INTRAVENOUS
  Filled 2019-08-04 (×7): qty 1

## 2019-08-04 MED ORDER — MIDAZOLAM HCL 2 MG/2ML IJ SOLN: 2.0000 mg | INTRAMUSCULAR | Status: DC | PRN

## 2019-08-04 MED ORDER — THROMBIN (RECOMBINANT) 20000 UNITS EX SOLR
CUTANEOUS | Status: AC
  Filled 2019-08-04: qty 20000

## 2019-08-04 MED ORDER — METOPROLOL TARTRATE 12.5 MG HALF TABLET: 12.5000 mg | ORAL_TABLET | Freq: Two times a day (BID) | ORAL | Status: DC

## 2019-08-04 MED ORDER — SODIUM CHLORIDE 0.9 % IV SOLN: 250.0000 mL | INTRAVENOUS | Status: DC

## 2019-08-04 MED ORDER — ROCURONIUM BROMIDE 10 MG/ML (PF) SYRINGE
PREFILLED_SYRINGE | INTRAVENOUS | Status: DC | PRN
  Administered 2019-08-04: 30 mg via INTRAVENOUS
  Administered 2019-08-04: 50 mg via INTRAVENOUS
  Administered 2019-08-04: 70 mg via INTRAVENOUS
  Administered 2019-08-04: 50 mg via INTRAVENOUS

## 2019-08-04 MED ORDER — SODIUM CHLORIDE 0.9 % IV SOLN
INTRAVENOUS | Status: DC
  Administered 2019-08-05: 12:00:00 via INTRAVENOUS

## 2019-08-04 MED ORDER — 0.9 % SODIUM CHLORIDE (POUR BTL) OPTIME
TOPICAL | Status: DC | PRN
  Administered 2019-08-04: 5000 mL

## 2019-08-04 MED ORDER — PROPOFOL 10 MG/ML IV BOLUS
INTRAVENOUS | Status: DC | PRN
  Administered 2019-08-04: 80 mg via INTRAVENOUS

## 2019-08-04 MED ORDER — ALBUMIN HUMAN 5 % IV SOLN
250.0000 mL | INTRAVENOUS | Status: DC | PRN
  Administered 2019-08-04 – 2019-08-05 (×3): 12.5 g via INTRAVENOUS
  Filled 2019-08-04: qty 500

## 2019-08-04 MED ORDER — CHLORHEXIDINE GLUCONATE 0.12% ORAL RINSE (MEDLINE KIT): 15.0000 mL | Freq: Two times a day (BID) | OROMUCOSAL | Status: DC

## 2019-08-04 MED ORDER — NITROGLYCERIN IN D5W 200-5 MCG/ML-% IV SOLN: 0.0000 ug/min | INTRAVENOUS | Status: DC

## 2019-08-04 MED ORDER — CARBOXYMETHYLCELLULOSE SOD PF 1 % OP GEL: 1.0000 "application " | Freq: Every day | OPHTHALMIC | Status: DC

## 2019-08-04 MED ORDER — SODIUM CHLORIDE 0.9 % IV SOLN
1.5000 g | Freq: Two times a day (BID) | INTRAVENOUS | Status: AC
  Administered 2019-08-04 – 2019-08-06 (×4): 1.5 g via INTRAVENOUS
  Filled 2019-08-04 (×4): qty 1.5

## 2019-08-04 MED ORDER — CHLORHEXIDINE GLUCONATE 0.12 % MT SOLN
15.0000 mL | OROMUCOSAL | Status: AC
  Administered 2019-08-04: 15 mL via OROMUCOSAL

## 2019-08-04 MED ORDER — HEPARIN SODIUM (PORCINE) 1000 UNIT/ML IJ SOLN
INTRAMUSCULAR | Status: DC | PRN
  Administered 2019-08-04: 25000 [IU] via INTRAVENOUS

## 2019-08-04 MED ORDER — FAMOTIDINE IN NACL 20-0.9 MG/50ML-% IV SOLN
20.0000 mg | Freq: Two times a day (BID) | INTRAVENOUS | Status: DC
  Administered 2019-08-04: 20 mg via INTRAVENOUS

## 2019-08-04 MED ORDER — SODIUM CHLORIDE 0.9% FLUSH
10.0000 mL | Freq: Two times a day (BID) | INTRAVENOUS | Status: DC
  Administered 2019-08-04 – 2019-08-07 (×6): 10 mL

## 2019-08-04 MED ORDER — ACETAMINOPHEN 500 MG PO TABS
1000.0000 mg | ORAL_TABLET | Freq: Four times a day (QID) | ORAL | Status: DC
  Administered 2019-08-04 – 2019-08-07 (×11): 1000 mg via ORAL
  Filled 2019-08-04 (×11): qty 2

## 2019-08-04 MED ORDER — DORZOLAMIDE HCL-TIMOLOL MAL PF 22.3-6.8 MG/ML OP SOLN: 1.0000 [drp] | Freq: Two times a day (BID) | OPHTHALMIC | Status: DC

## 2019-08-04 MED ORDER — ACETAMINOPHEN 650 MG RE SUPP
650.0000 mg | Freq: Once | RECTAL | Status: AC
  Administered 2019-08-04: 650 mg via RECTAL

## 2019-08-04 MED ORDER — PROPOFOL 10 MG/ML IV BOLUS
INTRAVENOUS | Status: AC
  Filled 2019-08-04: qty 20

## 2019-08-04 MED ORDER — ACETAMINOPHEN 160 MG/5ML PO SOLN: 1000.0000 mg | Freq: Four times a day (QID) | ORAL | Status: DC

## 2019-08-04 MED ORDER — FENTANYL CITRATE (PF) 250 MCG/5ML IJ SOLN
INTRAMUSCULAR | Status: AC
  Filled 2019-08-04: qty 25

## 2019-08-04 MED ORDER — ASPIRIN 81 MG PO CHEW: 324.0000 mg | CHEWABLE_TABLET | Freq: Every day | ORAL | Status: DC

## 2019-08-04 MED ORDER — VANCOMYCIN HCL IN DEXTROSE 1-5 GM/200ML-% IV SOLN
1000.0000 mg | Freq: Once | INTRAVENOUS | Status: AC
  Administered 2019-08-04: 1000 mg via INTRAVENOUS
  Filled 2019-08-04: qty 200

## 2019-08-04 MED ORDER — ATROPINE SULFATE 1 MG/10ML IJ SOSY
PREFILLED_SYRINGE | INTRAMUSCULAR | Status: AC
  Filled 2019-08-04: qty 10

## 2019-08-04 MED ORDER — MAGNESIUM SULFATE 4 GM/100ML IV SOLN
4.0000 g | Freq: Once | INTRAVENOUS | Status: AC
  Administered 2019-08-04: 4 g via INTRAVENOUS
  Filled 2019-08-04: qty 100

## 2019-08-04 MED ORDER — ARTIFICIAL TEARS OPHTHALMIC OINT
TOPICAL_OINTMENT | OPHTHALMIC | Status: DC | PRN
  Administered 2019-08-04: 1 via OPHTHALMIC

## 2019-08-04 MED ORDER — ROCURONIUM BROMIDE 10 MG/ML (PF) SYRINGE
PREFILLED_SYRINGE | INTRAVENOUS | Status: AC
  Filled 2019-08-04: qty 20

## 2019-08-04 MED ORDER — METOPROLOL TARTRATE 5 MG/5ML IV SOLN
2.5000 mg | INTRAVENOUS | Status: DC | PRN
  Administered 2019-08-04: 5 mg via INTRAVENOUS
  Filled 2019-08-04: qty 5

## 2019-08-04 MED ORDER — DORZOLAMIDE HCL-TIMOLOL MAL 2-0.5 % OP SOLN
1.0000 [drp] | Freq: Two times a day (BID) | OPHTHALMIC | Status: DC
  Administered 2019-08-05 – 2019-08-12 (×15): 1 [drp] via OPHTHALMIC
  Filled 2019-08-04: qty 10

## 2019-08-04 MED ORDER — PHENYLEPHRINE HCL-NACL 20-0.9 MG/250ML-% IV SOLN
0.0000 ug/min | INTRAVENOUS | Status: DC
  Administered 2019-08-05: 25 ug/min via INTRAVENOUS
  Filled 2019-08-04: qty 250

## 2019-08-04 MED ORDER — THROMBIN 20000 UNITS EX SOLR: CUTANEOUS | Status: DC | PRN

## 2019-08-04 MED ORDER — POTASSIUM CHLORIDE 10 MEQ/50ML IV SOLN: 10.0000 meq | INTRAVENOUS | Status: AC

## 2019-08-04 MED ORDER — PROTAMINE SULFATE 10 MG/ML IV SOLN
INTRAVENOUS | Status: AC
  Filled 2019-08-04: qty 25

## 2019-08-04 MED ORDER — SODIUM CHLORIDE 0.9% FLUSH
3.0000 mL | Freq: Two times a day (BID) | INTRAVENOUS | Status: DC
  Administered 2019-08-05 – 2019-08-07 (×6): 3 mL via INTRAVENOUS

## 2019-08-04 MED ORDER — DOCUSATE SODIUM 100 MG PO CAPS
200.0000 mg | ORAL_CAPSULE | Freq: Every day | ORAL | Status: DC
  Administered 2019-08-05 – 2019-08-07 (×3): 200 mg via ORAL
  Filled 2019-08-04 (×3): qty 2

## 2019-08-04 MED ORDER — CHLORHEXIDINE GLUCONATE CLOTH 2 % EX PADS
6.0000 | MEDICATED_PAD | Freq: Every day | CUTANEOUS | Status: DC
  Administered 2019-08-04 – 2019-08-07 (×4): 6 via TOPICAL

## 2019-08-04 MED ORDER — PANTOPRAZOLE SODIUM 40 MG PO TBEC
40.0000 mg | DELAYED_RELEASE_TABLET | Freq: Every day | ORAL | Status: DC
  Administered 2019-08-06 – 2019-08-07 (×2): 40 mg via ORAL
  Filled 2019-08-04 (×2): qty 1

## 2019-08-04 MED ORDER — SODIUM CHLORIDE 0.45 % IV SOLN: INTRAVENOUS | Status: DC | PRN

## 2019-08-04 MED ORDER — DEXMEDETOMIDINE HCL IN NACL 400 MCG/100ML IV SOLN: 0.0000 ug/kg/h | INTRAVENOUS | Status: DC

## 2019-08-04 MED ORDER — INSULIN REGULAR BOLUS VIA INFUSION
0.0000 [IU] | Freq: Three times a day (TID) | INTRAVENOUS | Status: DC
  Filled 2019-08-04: qty 10

## 2019-08-04 MED ORDER — BISACODYL 10 MG RE SUPP: 10.0000 mg | Freq: Every day | RECTAL | Status: DC

## 2019-08-04 MED ORDER — ACETAMINOPHEN 160 MG/5ML PO SOLN: 650.0000 mg | Freq: Once | ORAL | Status: AC

## 2019-08-04 MED ORDER — KETOTIFEN FUMARATE 0.025 % OP SOLN
1.0000 [drp] | Freq: Two times a day (BID) | OPHTHALMIC | Status: DC
  Administered 2019-08-04 – 2019-08-12 (×17): 1 [drp] via OPHTHALMIC
  Filled 2019-08-04: qty 5

## 2019-08-04 MED ORDER — ONDANSETRON HCL 4 MG/2ML IJ SOLN
4.0000 mg | Freq: Four times a day (QID) | INTRAMUSCULAR | Status: DC | PRN
  Administered 2019-08-06: 4 mg via INTRAVENOUS
  Filled 2019-08-04: qty 2

## 2019-08-04 MED ORDER — LACTATED RINGERS IV SOLN: 500.0000 mL | Freq: Once | INTRAVENOUS | Status: DC | PRN

## 2019-08-04 MED ORDER — TRAMADOL HCL 50 MG PO TABS: 50.0000 mg | ORAL_TABLET | ORAL | Status: DC | PRN

## 2019-08-04 MED ORDER — ASPIRIN EC 325 MG PO TBEC
325.0000 mg | DELAYED_RELEASE_TABLET | Freq: Every day | ORAL | Status: DC
  Administered 2019-08-05 – 2019-08-07 (×3): 325 mg via ORAL
  Filled 2019-08-04 (×3): qty 1

## 2019-08-04 MED ORDER — THROMBIN 20000 UNITS EX SOLR
OROMUCOSAL | Status: DC | PRN
  Administered 2019-08-04 (×3): 4 mL via TOPICAL

## 2019-08-04 MED ORDER — INSULIN REGULAR(HUMAN) IN NACL 100-0.9 UT/100ML-% IV SOLN: INTRAVENOUS | Status: DC

## 2019-08-04 MED ORDER — LACTATED RINGERS IV SOLN
INTRAVENOUS | Status: DC | PRN
  Administered 2019-08-04 (×2): via INTRAVENOUS

## 2019-08-04 MED ORDER — CARBOXYMETHYLCELLULOSE SOD PF 0.25 % OP SOLN: 1.0000 [drp] | Freq: Four times a day (QID) | OPHTHALMIC | Status: DC

## 2019-08-04 MED ORDER — HEMOSTATIC AGENTS (NO CHARGE) OPTIME
TOPICAL | Status: DC | PRN
  Administered 2019-08-04: 1 via TOPICAL

## 2019-08-04 MED ORDER — SODIUM CHLORIDE 0.9% FLUSH: 3.0000 mL | INTRAVENOUS | Status: DC | PRN

## 2019-08-04 SURGICAL SUPPLY — 115 items
BAG DECANTER FOR FLEXI CONT (MISCELLANEOUS) ×4 IMPLANT
BASKET HEART  (ORDER IN 25'S) (MISCELLANEOUS) ×1
BASKET HEART (ORDER IN 25'S) (MISCELLANEOUS) ×1
BASKET HEART (ORDER IN 25S) (MISCELLANEOUS) ×2 IMPLANT
BLADE CLIPPER SURG (BLADE) ×2 IMPLANT
BLADE NDL 3 SS STRL (BLADE) IMPLANT
BLADE NEEDLE 3 SS STRL (BLADE) ×3 IMPLANT
BLADE NEEDLE 3MM SS STRL (BLADE) ×1
BLADE STERNUM SYSTEM 6 (BLADE) ×4 IMPLANT
BNDG ELASTIC 4X5.8 VLCR STR LF (GAUZE/BANDAGES/DRESSINGS) ×4 IMPLANT
BNDG ELASTIC 6X5.8 VLCR STR LF (GAUZE/BANDAGES/DRESSINGS) ×4 IMPLANT
BNDG GAUZE ELAST 4 BULKY (GAUZE/BANDAGES/DRESSINGS) ×4 IMPLANT
CANISTER SUCT 3000ML PPV (MISCELLANEOUS) ×4 IMPLANT
CATH ROBINSON RED A/P 18FR (CATHETERS) ×8 IMPLANT
CATH THORACIC 28FR (CATHETERS) ×4 IMPLANT
CATH THORACIC 36FR (CATHETERS) ×4 IMPLANT
CATH THORACIC 36FR RT ANG (CATHETERS) ×4 IMPLANT
CLIP VESOCCLUDE MED 24/CT (CLIP) IMPLANT
CLIP VESOCCLUDE SM WIDE 24/CT (CLIP) ×2 IMPLANT
COVER WAND RF STERILE (DRAPES) ×2 IMPLANT
DERMABOND ADVANCED (GAUZE/BANDAGES/DRESSINGS) ×2
DERMABOND ADVANCED .7 DNX12 (GAUZE/BANDAGES/DRESSINGS) IMPLANT
DRAPE CARDIOVASCULAR INCISE (DRAPES) ×2
DRAPE SLUSH/WARMER DISC (DRAPES) ×4 IMPLANT
DRAPE SRG 135X102X78XABS (DRAPES) ×2 IMPLANT
DRSG COVADERM 4X14 (GAUZE/BANDAGES/DRESSINGS) ×4 IMPLANT
ELECT BLADE 4.0 EZ CLEAN MEGAD (MISCELLANEOUS) ×4
ELECT CAUTERY BLADE 6.4 (BLADE) ×4 IMPLANT
ELECT REM PT RETURN 9FT ADLT (ELECTROSURGICAL) ×8
ELECTRODE BLDE 4.0 EZ CLN MEGD (MISCELLANEOUS) IMPLANT
ELECTRODE REM PT RTRN 9FT ADLT (ELECTROSURGICAL) ×4 IMPLANT
FELT TEFLON 1X6 (MISCELLANEOUS) ×6 IMPLANT
GAUZE SPONGE 4X4 12PLY STRL (GAUZE/BANDAGES/DRESSINGS) ×8 IMPLANT
GLOVE BIO SURGEON STRL SZ 6 (GLOVE) IMPLANT
GLOVE BIO SURGEON STRL SZ 6.5 (GLOVE) IMPLANT
GLOVE BIO SURGEON STRL SZ7 (GLOVE) IMPLANT
GLOVE BIO SURGEON STRL SZ7.5 (GLOVE) ×4 IMPLANT
GLOVE BIO SURGEONS STRL SZ 6.5 (GLOVE)
GLOVE BIOGEL PI IND STRL 6 (GLOVE) IMPLANT
GLOVE BIOGEL PI IND STRL 6.5 (GLOVE) IMPLANT
GLOVE BIOGEL PI IND STRL 7.0 (GLOVE) IMPLANT
GLOVE BIOGEL PI IND STRL 7.5 (GLOVE) IMPLANT
GLOVE BIOGEL PI INDICATOR 6 (GLOVE)
GLOVE BIOGEL PI INDICATOR 6.5 (GLOVE) ×10
GLOVE BIOGEL PI INDICATOR 7.0 (GLOVE)
GLOVE BIOGEL PI INDICATOR 7.5 (GLOVE) ×8
GLOVE EUDERMIC 7 POWDERFREE (GLOVE) ×8 IMPLANT
GLOVE ORTHO TXT STRL SZ7.5 (GLOVE) IMPLANT
GLOVE SURG SIGNA 7.5 PF LTX (GLOVE) ×4 IMPLANT
GLOVE SURG SS PI 7.0 STRL IVOR (GLOVE) ×8 IMPLANT
GOWN STRL REUS W/ TWL LRG LVL3 (GOWN DISPOSABLE) ×8 IMPLANT
GOWN STRL REUS W/ TWL XL LVL3 (GOWN DISPOSABLE) ×2 IMPLANT
GOWN STRL REUS W/TWL LRG LVL3 (GOWN DISPOSABLE) ×12
GOWN STRL REUS W/TWL XL LVL3 (GOWN DISPOSABLE) ×2
HEMOSTAT POWDER SURGIFOAM 1G (HEMOSTASIS) ×12 IMPLANT
HEMOSTAT SURGICEL 2X14 (HEMOSTASIS) ×4 IMPLANT
INSERT FOGARTY 61MM (MISCELLANEOUS) IMPLANT
INSERT FOGARTY XLG (MISCELLANEOUS) IMPLANT
KIT BASIN OR (CUSTOM PROCEDURE TRAY) ×4 IMPLANT
KIT CATH CPB BARTLE (MISCELLANEOUS) ×4 IMPLANT
KIT SUCTION CATH 14FR (SUCTIONS) ×4 IMPLANT
KIT TURNOVER KIT B (KITS) ×4 IMPLANT
KIT VASOVIEW HEMOPRO 2 VH 4000 (KITS) ×4 IMPLANT
LEAD PACING MYOCARDI (MISCELLANEOUS) ×2 IMPLANT
NS IRRIG 1000ML POUR BTL (IV SOLUTION) ×20 IMPLANT
PACK E OPEN HEART (SUTURE) ×4 IMPLANT
PACK OPEN HEART (CUSTOM PROCEDURE TRAY) ×4 IMPLANT
PAD ARMBOARD 7.5X6 YLW CONV (MISCELLANEOUS) ×8 IMPLANT
PAD ELECT DEFIB RADIOL ZOLL (MISCELLANEOUS) ×4 IMPLANT
PENCIL BUTTON HOLSTER BLD 10FT (ELECTRODE) ×4 IMPLANT
POSITIONER HEAD DONUT 9IN (MISCELLANEOUS) ×4 IMPLANT
PUNCH AORTIC ROTATE 4.0MM (MISCELLANEOUS) IMPLANT
PUNCH AORTIC ROTATE 4.5MM 8IN (MISCELLANEOUS) ×4 IMPLANT
PUNCH AORTIC ROTATE 5MM 8IN (MISCELLANEOUS) IMPLANT
SET CARDIOPLEGIA MPS 5001102 (MISCELLANEOUS) ×2 IMPLANT
SPONGE INTESTINAL PEANUT (DISPOSABLE) IMPLANT
SPONGE LAP 18X18 RF (DISPOSABLE) ×2 IMPLANT
SPONGE LAP 4X18 RFD (DISPOSABLE) ×4 IMPLANT
SUT BONE WAX W31G (SUTURE) ×4 IMPLANT
SUT MNCRL AB 4-0 PS2 18 (SUTURE) ×2 IMPLANT
SUT PROLENE 3 0 SH DA (SUTURE) IMPLANT
SUT PROLENE 3 0 SH1 36 (SUTURE) ×4 IMPLANT
SUT PROLENE 4 0 RB 1 (SUTURE)
SUT PROLENE 4 0 SH DA (SUTURE) IMPLANT
SUT PROLENE 4-0 RB1 .5 CRCL 36 (SUTURE) IMPLANT
SUT PROLENE 5 0 C 1 36 (SUTURE) IMPLANT
SUT PROLENE 6 0 C 1 30 (SUTURE) ×2 IMPLANT
SUT PROLENE 7 0 BV 1 (SUTURE) ×2 IMPLANT
SUT PROLENE 7 0 BV1 MDA (SUTURE) ×6 IMPLANT
SUT PROLENE 8 0 BV175 6 (SUTURE) IMPLANT
SUT SILK  1 MH (SUTURE)
SUT SILK 1 MH (SUTURE) IMPLANT
SUT SILK 2 0 SH (SUTURE) IMPLANT
SUT STEEL 6MS V (SUTURE) ×4 IMPLANT
SUT STEEL STERNAL CCS#1 18IN (SUTURE) IMPLANT
SUT STEEL SZ 6 DBL 3X14 BALL (SUTURE) IMPLANT
SUT TEM PAC WIRE 2 0 SH (SUTURE) ×2 IMPLANT
SUT VIC AB 1 CTX 36 (SUTURE) ×4
SUT VIC AB 1 CTX36XBRD ANBCTR (SUTURE) ×4 IMPLANT
SUT VIC AB 2-0 CT1 27 (SUTURE) ×2
SUT VIC AB 2-0 CT1 TAPERPNT 27 (SUTURE) IMPLANT
SUT VIC AB 2-0 CTX 27 (SUTURE) IMPLANT
SUT VIC AB 3-0 SH 27 (SUTURE)
SUT VIC AB 3-0 SH 27X BRD (SUTURE) IMPLANT
SUT VIC AB 3-0 X1 27 (SUTURE) IMPLANT
SUT VICRYL 4-0 PS2 18IN ABS (SUTURE) IMPLANT
SYSTEM SAHARA CHEST DRAIN ATS (WOUND CARE) ×4 IMPLANT
TAPE CLOTH SURG 4X10 WHT LF (GAUZE/BANDAGES/DRESSINGS) ×4 IMPLANT
TAPE PAPER 2X10 WHT MICROPORE (GAUZE/BANDAGES/DRESSINGS) ×2 IMPLANT
TOWEL GREEN STERILE (TOWEL DISPOSABLE) ×4 IMPLANT
TOWEL GREEN STERILE FF (TOWEL DISPOSABLE) ×4 IMPLANT
TRAY FOLEY SLVR 16FR TEMP STAT (SET/KITS/TRAYS/PACK) ×4 IMPLANT
TUBING LAP HI FLOW INSUFFLATIO (TUBING) ×4 IMPLANT
UNDERPAD 30X30 (UNDERPADS AND DIAPERS) ×4 IMPLANT
WATER STERILE IRR 1000ML POUR (IV SOLUTION) ×8 IMPLANT

## 2019-08-04 NOTE — Anesthesia Procedure Notes (Signed)
Arterial Line Insertion Start/End10/29/2020 7:30 AM, 08/04/2019 7:35 AM Performed by: Wilburn Cornelia, CRNA, CRNA  Patient location: ICU. Preanesthetic checklist: patient identified, IV checked, site marked, risks and benefits discussed, surgical consent, monitors and equipment checked, pre-op evaluation, timeout performed and anesthesia consent Lidocaine 1% used for infiltration Left, radial was placed Catheter size: 20 G Hand hygiene performed  and maximum sterile barriers used  Allen's test indicative of satisfactory collateral circulation Attempts: 1 Procedure performed without using ultrasound guided technique. Following insertion, Biopatch and dressing applied. Post procedure assessment: normal  Patient tolerated the procedure well with no immediate complications.

## 2019-08-04 NOTE — Anesthesia Procedure Notes (Signed)
Procedure Name: Intubation Date/Time: 08/04/2019 8:24 AM Performed by: Wilburn Cornelia, CRNA Pre-anesthesia Checklist: Patient identified, Emergency Drugs available, Suction available, Patient being monitored and Timeout performed Patient Re-evaluated:Patient Re-evaluated prior to induction Oxygen Delivery Method: Circle system utilized Preoxygenation: Pre-oxygenation with 100% oxygen Induction Type: IV induction Ventilation: Mask ventilation without difficulty and Oral airway inserted - appropriate to patient size Laryngoscope Size: Mac and 4 Grade View: Grade II Tube type: Oral Tube size: 8.0 mm Number of attempts: 1 Airway Equipment and Method: Stylet Placement Confirmation: ETT inserted through vocal cords under direct vision,  positive ETCO2,  breath sounds checked- equal and bilateral and CO2 detector Secured at: 23 cm Tube secured with: Tape Dental Injury: Teeth and Oropharynx as per pre-operative assessment

## 2019-08-04 NOTE — Anesthesia Procedure Notes (Signed)
Central Venous Catheter Insertion Performed by: Effie Berkshire, MD, anesthesiologist Start/End10/29/2020 8:45 AM, 08/04/2019 8:47 AM Patient location: Pre-op. Preanesthetic checklist: patient identified, IV checked, site marked, risks and benefits discussed, surgical consent, monitors and equipment checked, pre-op evaluation, timeout performed and anesthesia consent Hand hygiene performed  and maximum sterile barriers used  PA cath was placed.Swan type:thermodilution Procedure performed without using ultrasound guided technique. Attempts: 1 Patient tolerated the procedure well with no immediate complications.

## 2019-08-04 NOTE — Progress Notes (Signed)
Discussed need for covid swab pre-procedure with anesthesia.  Paper copy of negative result from 10/28 in chart.

## 2019-08-04 NOTE — Progress Notes (Addendum)
Progress Note  Patient Name: Paul Torres Date of Encounter: 08/04/2019  Primary Cardiologist: No primary care provider on file.   Subjective   This morning, he complained of reproducible chest pain without shortness of breath.  I was able to talk to his daughter Juliette Alcide) was at the bedside and she was up-to-date regarding her father's planned CABG today.  Inpatient Medications    Scheduled Meds:  aspirin  81 mg Oral Daily   atropine       Chlorhexidine Gluconate Cloth  6 each Topical Daily   epinephrine  0-10 mcg/min Intravenous To OR   heparin-papaverine-plasmalyte irrigation   Irrigation To OR   insulin   Intravenous To OR   magnesium sulfate  40 mEq Other To OR   phenylephrine  30-200 mcg/min Intravenous To OR   potassium chloride  80 mEq Other To OR   sodium chloride flush  3 mL Intravenous Q12H   sodium chloride flush  3 mL Intravenous Q12H   tranexamic acid  15 mg/kg Intravenous To OR   tranexamic acid  2 mg/kg Intracatheter To OR   traZODone  50 mg Oral QHS   Continuous Infusions:  sodium chloride 10 mL/hr at 08/04/19 0600   sodium chloride     sodium chloride     sodium chloride 1 mL/kg/hr (08/03/19 2351)   cefUROXime (ZINACEF)  IV     cefUROXime (ZINACEF)  IV     dexmedetomidine     DOPamine     heparin 30,000 units/NS 1000 mL solution for CELLSAVER     heparin 900 Units/hr (08/04/19 0600)   milrinone     nitroGLYCERIN     tranexamic acid (CYKLOKAPRON) infusion ([facility])     vancomycin     PRN Meds: sodium chloride, sodium chloride, acetaminophen, ondansetron (ZOFRAN) IV, oxyCODONE, sodium chloride flush, sodium chloride flush, temazepam   Vital Signs    Vitals:   08/04/19 0300 08/04/19 0400 08/04/19 0500 08/04/19 0600  BP: 102/63 (!) 134/52 (!) 117/56 130/64  Pulse: 83 64 79 81  Resp: Temp:  98.1 F (36.7 C)    TempSrc:  Oral    SpO2: 97% 95% 100% 99%  Weight:      Height:        Intake/Output Summary  (Last 24 hours) at 08/04/2019 8119 Last data filed at 08/04/2019 0600 Gross per 24 hour  Intake 1273.89 ml  Output 850 ml  Net 423.89 ml   Filed Weights   08/03/19 1200  Weight: 73.5 kg    Telemetry    No arrhythmia- Personally Reviewed  ECG    Ventricularly paced rhythm, TWI in the anterior lateral leads- Personally Reviewed  Physical Exam   GEN: No acute distress.   Cardiac: RRR, no murmurs, rubs, or gallops.  Respiratory: Clear to auscultation bilaterally. MS: No edema; No deformity, multiple bruises on the anterior shin bilaterally lower extremities Neuro:  Nonfocal  Psych: Normal affect   Labs    Chemistry Recent Labs  Lab 08/03/19 1503 08/04/19 0235 08/04/19 0406  NA  --  139 140  K  --  3.7 3.6  CL  --  107  --   CO2  --  24  --   GLUCOSE  --  134*  --   BUN  --  12  --   CREATININE 1.82* 1.57*  --   CALCIUM  --  8.1*  --   GFRNONAA 36* 43*  --   GFRAA 42*  50*  --   ANIONGAP  --  8  --      Hematology Recent Labs  Lab 08/03/19 1503 08/04/19 0235 08/04/19 0406  WBC 7.3 7.7  --   RBC 3.27* 3.15*  --   HGB 9.7* 9.5* 12.2*  HCT 28.3* 28.0* 36.0*  MCV 86.5 88.9  --   MCH 29.7 30.2  --   MCHC 34.3 33.9  --   RDW 13.2 13.2  --   PLT 142* 147*  --     Cardiac EnzymesNo results for input(s): TROPONINI in the last 168 hours. No results for input(s): TROPIPOC in the last 168 hours.   BNPNo results for input(s): BNP, PROBNP in the last 168 hours.   DDimer No results for input(s): DDIMER in the last 168 hours.   Radiology    Dg Chest Port 1 View  Result Date: 08/03/2019 CLINICAL DATA:  Temporary pacemaker EXAM: PORTABLE CHEST 1 VIEW COMPARISON:  Portable exam 1525 hours compared to 08/02/2019 FINDINGS: Pacemaker via infra diaphragmatic approach projects over the RIGHT ventricle. External pacing leads are present. Enlargement of cardiac silhouette with pulmonary vascular congestion. Minimal perihilar edema greater on LEFT. Subsegmental  atelectasis at LEFT base. No gross pleural effusion or pneumothorax. IMPRESSION: Pacemaker lead projects over RIGHT ventricle. Enlargement of cardiac silhouette with vascular congestion and minimal perihilar edema. Subsegmental atelectasis LEFT base. Electronically Signed   By: Ulyses Southward M.D.   On: 08/03/2019 15:57   Vas US Doppler Pre Cabg  Result Date: 08/03/2019 PREOPERATIVE VASCULAR EVALUATION  Indications:      Pre-surgical evaluation. Risk Factors:     Hypertension, hyperlipidemia, Diabetes. Comparison Study: No prior study. Performing Technologist: Gertie Fey MHA, RDMS, RVT, RDCS  Examination Guidelines: A complete evaluation includes B-mode imaging, spectral Doppler, color Doppler, and power Doppler as needed of all accessible portions of each vessel. Bilateral testing is considered an integral part of a complete examination. Limited examinations for reoccurring indications may be performed as noted. This study was performed with a priority of STAT, therefore portions of this exam may be limited.  Right Carotid Findings: +----------+--------+--------+--------+-----------------------+--------+             PSV cm/s EDV cm/s Stenosis Describe                Comments  +----------+--------+--------+--------+-----------------------+--------+  CCA Prox   80       14                                                  +----------+--------+--------+--------+-----------------------+--------+  CCA Distal 49       13                                                  +----------+--------+--------+--------+-----------------------+--------+  ICA Prox   90       22                smooth and heterogenous           +----------+--------+--------+--------+-----------------------+--------+  ICA Distal 52       14                                                  +----------+--------+--------+--------+-----------------------+--------+  ECA        139      14                                                   +----------+--------+--------+--------+-----------------------+--------+ Portions of this table do not appear on this page. +----------+--------+-------+----------------+------------+             PSV cm/s EDV cms Describe         Arm Pressure  +----------+--------+-------+----------------+------------+  Subclavian 113              Multiphasic, WNL               +----------+--------+-------+----------------+------------+ +---------+--------+--+--------+--+---------+  Vertebral PSV cm/s 38 EDV cm/s 10 Antegrade  +---------+--------+--+--------+--+---------+ Left Carotid Findings: +----------+-------+-------+--------+---------------------------------+--------+             PSV     EDV     Stenosis Describe                          Comments              cm/s    cm/s                                                         +----------+-------+-------+--------+---------------------------------+--------+  CCA Prox   81      15                                                           +----------+-------+-------+--------+---------------------------------+--------+  CCA Distal 55      13               smooth and heterogenous                     +----------+-------+-------+--------+---------------------------------+--------+  ICA Prox   48      14               irregular, heterogenous and                                                      calcific                                    +----------+-------+-------+--------+---------------------------------+--------+  ICA Distal 64      18                                                           +----------+-------+-------+--------+---------------------------------+--------+  ECA        83  smooth                                      +----------+-------+-------+--------+---------------------------------+--------+ +----------+--------+--------+----------------+------------+  Subclavian PSV cm/s EDV cm/s Describe         Arm Pressure   +----------+--------+--------+----------------+------------+             72                Multiphasic, WNL 140           +----------+--------+--------+----------------+------------+ +---------+--------+--+--------+--+---------+  Vertebral PSV cm/s 82 EDV cm/s 25 Antegrade  +---------+--------+--+--------+--+---------+  ABI Findings: +--------+------------------+-----+---------+--------+  Right    Rt Pressure (mmHg) Index Waveform  Comment   +--------+------------------+-----+---------+--------+  Brachial                          biphasic            +--------+------------------+-----+---------+--------+  PTA                               biphasic            +--------+------------------+-----+---------+--------+  DP                                triphasic           +--------+------------------+-----+---------+--------+ +--------+------------------+-----+--------+-------+  Left     Lt Pressure (mmHg) Index Waveform Comment  +--------+------------------+-----+--------+-------+  Brachial 140                      biphasic          +--------+------------------+-----+--------+-------+  PTA                               biphasic          +--------+------------------+-----+--------+-------+  DP                                biphasic          +--------+------------------+-----+--------+-------+  Right Doppler Findings: +-----------+--------+-----+--------+-----------------------------------------+  Site        Pressure Index Doppler  Comments                                   +-----------+--------+-----+--------+-----------------------------------------+  Brachial                   biphasic                                            +-----------+--------+-----+--------+-----------------------------------------+  Radial                     biphasic                                            +-----------+--------+-----+--------+-----------------------------------------+  Ulnar  Unable to insonate  due to position         +-----------+--------+-----+--------+-----------------------------------------+  Palmar Arch                         Unable to evaluate due to TR band                                               placement.                                 +-----------+--------+-----+--------+-----------------------------------------+  Left Doppler Findings: +-----------+--------+-----+--------+------------------------------------------+  Site        Pressure Index Doppler  Comments                                    +-----------+--------+-----+--------+------------------------------------------+  Brachial    140            biphasic                                             +-----------+--------+-----+--------+------------------------------------------+  Palmar Arch                         Signal is unaffected with radial                                                 compression, decreases >50% with ulnar                                           compression.                                +-----------+--------+-----+--------+------------------------------------------+  Summary: Right Carotid: Velocities in the right ICA are consistent with a 1-39% stenosis. Left Carotid: Velocities in the left ICA are consistent with a 1-39% stenosis. Vertebrals:  Bilateral vertebral arteries demonstrate antegrade flow. Subclavians: Left subclavian artery was not visualized. Normal flow hemodynamics              were seen in the right subclavian artery.     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  Anomalous origin of the circumflex coronary artery  from the right sinus of Valsalva.  Total occlusion of the right coronary filling late by collaterals from the left coronary.  Widely patent left main.  Unable to determine if the left main courses between the pulmonary artery and aorta.    95 to 99% proximal LAD  stenosis.  Mid to distal LAD is diffusely diseased up to 80% and may be intramyocardial.  A large ramus intermedius contains proximal 70 to 80% stenosis.  Circumflex distribution is supplied by a vessel that has segmental 90% calcified stenosis before branching onto the lateral wall.  The distal circumflex territory is not well visualized and appears to be from a retroaortic vessel arising from the right coronary.  No significant branches are noted on the left inferolateral wall.  LVEDP was normal.  Echocardiogram done earlier today suggests inferior wall akinesis.  EF 60%.   Successful placement of temporary transvenous pacemaker from right femoral vein.  High threshold.  RECOMMENDATIONS:   TCTS consultation for surgical revascularization.  Consider coronary CT to determine course of left main although if bypass surgery is going to be performed, it may not serve any benefit.  EP consultation.  Patient Profile     73 y.o. male with  type 2 diabetes mellitus, hypertension, dyslipidemia, gout, prior tobacco use disorder who [facility] following a syncopal episode with EKG and echocardiogram findings concerning for RCA infarct as EKG showed inferior wall infarction and echocardiogram revealing inferior wall dyskinesis.  [facility]  He underwent LHC on 08/03/2019 which revealed severely occluded RCA, 95-99% proximal LAD stenosis and severely distributed multivessel disease  Assessment & Plan    #Severe multivessel coronary artery disease Underwent LHC yesterday which reviewed severe multivessel CAD.  Unfortunately he had an episode of asystole lasting  about 30 to 40 seconds which required 10 chest compressions and placement of temporary pacer wire in the RV.  An echocardiogram was performed which showed an LVEF of 45-50%. -Planned CABG today -Continue aspirin, heparin -Follow up official echo read  #Type 2 diabetes mellitus Well-controlled.  Hemoglobin A1c of 5.6% -SSI while admitted  #Hyperlipidemia Follow-up lipid panel -Resume statin postop  #Hypertension BP stable  For questions or updates, please contact [facility] Please consult [email or web address] for contact info under Cardiology/STEMI.      Signed, Yvette Rackbed K Alexandre Faries, MD  08/04/2019, 6:28 AM

## 2019-08-04 NOTE — Brief Op Note (Signed)
08/03/2019 - 08/04/2019  12:28 PM  PATIENT:  Paul Torres  73 y.o. male  PRE-OPERATIVE DIAGNOSIS:  CAD  POST-OPERATIVE DIAGNOSIS:  CAD  PROCEDURE:  CORONARY ARTERY BYPASS GRAFTING (CABG) x 5 WITH ENDOSCOPIC HARVESTING OF RIGHT GREATER SAPHENOUS VEIN.   LIMA-> LAD  SVG->D1 SVG->OM1->OM3 (sequential graft) SVG->PDA  TRANSESOPHAGEAL ECHOCARDIOGRAM (TEE)   SURGEON:  Gaye Pollack, MD - Primary  PHYSICIAN ASSISTANT: Roddenberry  ANESTHESIA:   general  EBL:  Per perfusion and anesthesia records  BLOOD ADMINISTERED:2 units given on pump CC PRBC  DRAINS: Left pleural and mediastinal drains   LOCAL MEDICATIONS USED:  NONE  SPECIMEN:  No Specimen  DISPOSITION OF SPECIMEN:  N/A  COUNTS:  YES  DICTATION: .Dragon Dictation  PLAN OF CARE: Admit to inpatient   PATIENT DISPOSITION:  ICU - intubated and hemodynamically stable.   Delay start of Pharmacological VTE agent (>24hrs) due to surgical blood loss or risk of bleeding: yes

## 2019-08-04 NOTE — Anesthesia Preprocedure Evaluation (Addendum)
Anesthesia Evaluation  Patient identified by MRN, date of birth, ID band Patient awake    Reviewed: Allergy & Precautions, NPO status , Patient's Chart, lab work & pertinent test results  Airway Mallampati: I  TM Distance: >3 FB Neck ROM: Full    Dental  (+) Upper Dentures, Lower Dentures   Pulmonary neg pulmonary ROS,    breath sounds clear to auscultation       Cardiovascular hypertension, + CAD and + Past MI   Rhythm:Regular Rate:Bradycardia     Neuro/Psych negative neurological ROS  negative psych ROS   GI/Hepatic Neg liver ROS, GERD  Medicated,  Endo/Other  diabetes, Type 2, Oral Hypoglycemic Agents, Insulin Dependent  Renal/GU negative Renal ROS     Musculoskeletal negative musculoskeletal ROS (+)   Abdominal Normal abdominal exam  (+)   Peds  Hematology negative hematology ROS (+)   Anesthesia Other Findings   Reproductive/Obstetrics                           Anesthesia Physical Anesthesia Plan  ASA: IV  Anesthesia Plan: General   Post-op Pain Management:    Induction: Intravenous  PONV Risk Score and Plan: 0 and Ondansetron  Airway Management Planned: Oral ETT  Additional Equipment: Arterial line, CVP, PA Cath, Ultrasound Guidance Line Placement and TEE  Intra-op Plan:   Post-operative Plan: Post-operative intubation/ventilation  Informed Consent: I have reviewed the patients History and Physical, chart, labs and discussed the procedure including the risks, benefits and alternatives for the proposed anesthesia with the patient or authorized representative who has indicated his/her understanding and acceptance.     Dental advisory given  Plan Discussed with: CRNA  Anesthesia Plan Comments:        Anesthesia Quick Evaluation

## 2019-08-04 NOTE — Transfer of Care (Signed)
Immediate Anesthesia Transfer of Care Note  Patient: Dequavion Follette  Procedure(s) Performed: CORONARY ARTERY BYPASS GRAFTING (CABG) x 5 WITH ENDOSCOPIC HARVESTING OF RIGHT GREATER SAPHENOUS VEIN. LIMA TO LAD (N/A Chest) TRANSESOPHAGEAL ECHOCARDIOGRAM (TEE) (N/A )  Patient Location: SICU  Anesthesia Type:General  Level of Consciousness: Patient remains intubated per anesthesia plan  Airway & Oxygen Therapy: Patient remains intubated per anesthesia plan and Patient placed on Ventilator (see vital sign flow sheet for setting)  Post-op Assessment: Report given to RN and Post -op Vital signs reviewed and stable  Post vital signs: Reviewed and stable  Last Vitals:  Vitals Value Taken Time  BP    Temp    Pulse    Resp    SpO2     HR 92, BP 112/52, pt on vent, sats 100% Last Pain:  Vitals:   08/04/19 0400  TempSrc: Oral  PainSc: 0-No pain      Patients Stated Pain Goal: 0 (33/82/50 5397)  Complications: No apparent anesthesia complications

## 2019-08-04 NOTE — Progress Notes (Signed)
Right femoral venous sheath removed per MD order at 1718. Manual pressure held for 10 mins. Pressure dressing applied. VSS.

## 2019-08-04 NOTE — Anesthesia Procedure Notes (Signed)
Central Venous Catheter Insertion Performed by: Effie Berkshire, MD, anesthesiologist Start/End10/29/2020 8:35 AM, 08/04/2019 8:45 AM Patient location: Pre-op. Preanesthetic checklist: patient identified, IV checked, site marked, risks and benefits discussed, surgical consent, monitors and equipment checked, pre-op evaluation, timeout performed and anesthesia consent Position: Trendelenburg Lidocaine 1% used for infiltration and patient sedated Hand hygiene performed , maximum sterile barriers used  and Seldinger technique used Catheter size: 9 Fr Total catheter length 10. Central line was placed.MAC introducer Swan type:thermodilution PA Cath depth:50 Procedure performed using ultrasound guided technique. Ultrasound Notes:anatomy identified, needle tip was noted to be adjacent to the nerve/plexus identified, no ultrasound evidence of intravascular and/or intraneural injection and image(s) printed for medical record Attempts: 1 Following insertion, line sutured and dressing applied. Post procedure assessment: blood return through all ports, free fluid flow and no air  Patient tolerated the procedure well with no immediate complications.

## 2019-08-04 NOTE — Progress Notes (Signed)
Pt has been ST in the 110's all night and hypertensive on nitro. Pt given PRN metoprolol to resolve the above, given very slowly over 10 minutes. Pt then became hypotensive very suddenly into SBP 70's. Pt pacer started picking up with backup rate VVI in 60's. Pacer mode changed to DDD @ 90. Nitro gtt stopped, albumin given, neo restarted.

## 2019-08-04 NOTE — Progress Notes (Addendum)
EVENING ROUNDS NOTE :     Paul Torres       [place] [address]             [phone]                 Day of Surgery Procedure(s) (LRB): CORONARY ARTERY BYPASS GRAFTING (CABG) x 5 WITH ENDOSCOPIC HARVESTING OF RIGHT GREATER SAPHENOUS VEIN. LIMA TO LAD (N/A) TRANSESOPHAGEAL ECHOCARDIOGRAM (TEE) (N/A)  Total Length of Stay:  LOS: 1 day  BP 115/63   Pulse 96   Temp 99.5 F (37.5 C)   Resp 15   Ht 5\' 10"  (1.778 m)   Wt 73.5 kg   SpO2 100%   BMI 23.24 kg/m   .Intake/Output      10/28 0701 - 10/29 0700 10/29 0701 - 10/30 0700   P.O. 120    I.V. (mL/kg) 1153.9 (15.7) 2216 (30.1)   Blood  300   IV Piggyback  362.8   Total Intake(mL/kg) 1273.9 (17.3) 2878.8 (39.2)   Urine (mL/kg/hr) 850 1285 (1.5)   Chest Tube  200   Total Output 850 1485   Net +423.9 +1393.8        Urine Occurrence 1 x      . sodium chloride 20 mL/hr at 08/04/19 1800  . [START ON 08/05/2019] sodium chloride    . sodium chloride Stopped (08/04/19 1716)  . albumin human 12.5 g (08/04/19 1429)  . cefUROXime (ZINACEF)  IV    . dexmedetomidine (PRECEDEX) IV infusion Stopped (08/04/19 1728)  . famotidine (PEPCID) IV Stopped (08/04/19 1453)  . insulin 0.9 mL/hr at 08/04/19 1800  . lactated ringers    . lactated ringers 20 mL/hr at 08/04/19 1800  . lactated ringers    . magnesium sulfate 20 mL/hr at 08/04/19 1800  . nitroGLYCERIN Stopped (08/04/19 1410)  . phenylephrine (NEO-SYNEPHRINE) Adult infusion Stopped (08/04/19 1626)  . vancomycin       Lab Results  Component Value Date   WBC 10.8 (H) 08/04/2019   HGB 10.8 (L) 08/04/2019   HCT 30.6 (L) 08/04/2019   PLT 101 (L) 08/04/2019   GLUCOSE 134 (H) 08/04/2019   CHOL 72 08/04/2019   TRIG 69 08/04/2019   HDL 26 (L) 08/04/2019   LDLCALC 32 08/04/2019   NA 140 08/04/2019   K 3.6 08/04/2019   CL 107 08/04/2019   CREATININE 1.57 (H) 08/04/2019   BUN 12 08/04/2019   CO2 24 08/04/2019   INR 1.5 (H) 08/04/2019   HGBA1C 5.6  08/04/2019  Low grade fever to 99.5. He is being weaned to extubate. He opened his eyes when spoken to this evening. CO/CI 4.7/2.5 Chest tube output low at 200 since surgery Started on Nitro drip for hypertension Await labs, continue current post op care  Lars Pinks PA-C 08/04/2019 6:21 PM  I have seen and examined the patient and agree with the assessment and plan as outlined.  Doing well early postop.  Extubated uneventfully.  Rexene Alberts, MD 08/04/2019 9:45 PM

## 2019-08-04 NOTE — Progress Notes (Signed)
  Echocardiogram Echocardiogram Transesophageal has been performed.  Paul Torres 08/04/2019, 9:03 AM

## 2019-08-04 NOTE — OR Nursing (Signed)
1337 2 Heart Charge Nurse called and given 20 minute ETA.

## 2019-08-04 NOTE — Procedures (Signed)
Extubation Procedure Note  Patient Details:   Name: Paul Torres DOB: 01/30/1946 MRN: [id number]   Airway Documentation:    Vent end date: 08/04/19 Vent end time: 1835   Evaluation  O2 sats: stable throughout Complications: No apparent complications Patient did tolerate procedure well. Bilateral Breath Sounds: Diminished, Clear   Yes   RT extubated patient to 3L [place] per rapid wean protocol with RN. Positive cuff leak noted. NIF -23 and VC 650. Pt sat 100%.No stridor noted. Patient able to speak. RN will work with patient on IS. RT will continue to monitor as needed.   Vernona Rieger 08/04/2019, 6:40 PM

## 2019-08-04 NOTE — OR Nursing (Signed)
1312 2 Heart Charge nurse called and given 45 minute ETA.

## 2019-08-04 NOTE — Progress Notes (Signed)
Patient extubated to 4 L [place] per MD order at [place]. Daughter [name] called to update as requested. Voicemail left.

## 2019-08-04 NOTE — Op Note (Signed)
CARDIOVASCULAR SURGERY OPERATIVE NOTE  08/04/2019  Surgeon:  Alleen Borne, MD  First Assistant: Jillyn Hidden,  PA-C   Preoperative Diagnosis:  Severe multi-vessel coronary artery disease   Postoperative Diagnosis:  Same   Procedure:  1. Median Sternotomy 2. Extracorporeal circulation 3.   Coronary artery bypass grafting x 5   Left internal mammary graft to the LAD  SVG to D1  Sequential SVG to OM1 and OM2  SVG to PDA 4.   Endoscopic vein harvest from the right leg   Anesthesia:  General Endotracheal   Clinical History/Surgical Indication:  73 year old male with multiple comorbidities including past smoking history, type 2 diabetes mellitus, hypertension, and dyslipidemia was transferred from [facility] for suspected recent inferior myocardial infarction and multiple syncopal episodes over the past 2 weeks. 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 EF by echo at [facility] 60% but LVEF moderately depressed by echo done here today. He presented with recurrent syncope and has had documented episodes of asystole on monitor requiring transvenous pacer. It is unclear whether this is ischemic and will resolve with CABG or primary conduction disease. Plan CABG and then evaluation of rhythm postop to decide on the need for PPM. I discussed the operative procedure with the patient and his daughter including alternatives, benefits and risks; including but not limited to bleeding, blood transfusion, infection, stroke, myocardial infarction, graft failure, heart block requiring a permanent pacemaker, organ dysfunction, and death.  [name] understands and agrees to proceed.  Preparation:  The patient was seen in the preoperative holding area and the correct patient, correct operation were  confirmed with the patient after reviewing the medical record and catheterization. The consent was signed by me. Preoperative antibiotics were given. A pulmonary arterial line and radial arterial line were placed by the anesthesia team. The patient was taken back to the operating room and positioned supine on the operating room table. After being placed under general endotracheal anesthesia by the anesthesia team a foley catheter was placed. The neck, chest, abdomen, and both legs were prepped with betadine soap and solution and draped in the usual sterile manner. A surgical time-out was taken and the correct patient and operative procedure were confirmed with the nursing and anesthesia staff.   Cardiopulmonary Bypass:  A median sternotomy was performed. The pericardium was opened in the midline. Right ventricular function appeared normal. The ascending aorta was of normal size and had no palpable plaque. There were no contraindications to aortic cannulation or cross-clamping. The patient was fully systemically heparinized and the ACT was maintained > 400 sec. The proximal aortic arch was cannulated with a 20 F aortic cannula for arterial inflow. Venous cannulation was performed via the right atrial appendage using a two-staged venous cannula. An antegrade cardioplegia/vent cannula was inserted into the mid-ascending aorta. Aortic occlusion was performed with a single cross-clamp. Systemic cooling to 32 degrees Centigrade and topical cooling of the heart with iced saline were used. Hyperkalemic antegrade cold blood cardioplegia was used to induce diastolic arrest and was then given at about 20 minute intervals throughout the period of arrest to maintain myocardial temperature at or below 10 degrees centigrade. A temperature probe was inserted into the interventricular septum and an insulating pad was placed in the pericardium.   Left internal mammary harvest:  The left side of the sternum was retracted  using the Rultract retractor. The left internal mammary artery was  harvested as a pedicle graft. All side branches were clipped. It was a medium-sized vessel of good quality with excellent blood flow. It was ligated distally and divided. It was sprayed with topical papaverine solution to prevent vasospasm.   Endoscopic vein harvest:  The right greater saphenous vein was harvested endoscopically through a 2 cm incision medial to the right knee. It was harvested from the upper thigh to below the knee. It was a medium-sized vein of good quality. The side branches were all ligated with 4-0 silk ties.    Coronary arteries:  The coronary arteries were examined.   LAD:  Diffusely diseased. Distal vessel small but graftable. There was a moderate sized high diagonal that did not show up on cath and must have been occluded.  LCX:  [place]amus diffusely diseased proximally and then became deep intramyocardial and not suitable for grafting. Moderate sized OM1 and OM2. The distal LCX branches were small and the surrounding myocardium was thinned scar.  RCA:  Small and diffusely diseased. The PDA was small but graftable proximally.   Grafts:  1. LIMA to the LAD: <1.5 mm distally. It was sewn end to side using 8-0 prolene continuous suture. 2. SVG to diagonal:  1.6 mm. It was sewn end to side using 7-0 prolene continuous suture. 3. Sequential SVG to OM1:  1.6 mm. It was sewn sequential side to side using 7-0 prolene continuous suture. 4. Sequential SVG to OM2:  1.6 mm. It was sewn sequential end to side using 7-0 prolene continuous suture. 5.   SVG to PDA: 1.5 mm. It was sewn end to side using 7-0 prolene continuous suture.  The proximal vein graft anastomoses were performed to the mid-ascending aorta using continuous 6-0 prolene suture. Graft markers were placed around the proximal anastomoses.   Completion:  The patient was rewarmed to 37 degrees Centigrade. The clamp was removed from the LIMA pedicle  and there was rapid warming of the septum and return of ventricular fibrillation. The crossclamp was removed with a time of 113 minutes. There was spontaneous return of sinus rhythm. The distal and proximal anastomoses were checked for hemostasis. The position of the grafts was satisfactory. Two temporary epicardial pacing wires were placed on the right atrium and two on the right ventricle. The patient was weaned from CPB without difficulty on no inotropes. CPB time was 134 minutes. Cardiac output was 5 LPM. TEE showed improved LV and RV systolic function. Heparin was fully reversed with protamine and the aortic and venous cannulas removed. Hemostasis was achieved. Mediastinal and left pleural drainage tubes were placed. The sternum was closed with  #6 stainless steel wires. The fascia was closed with continuous # 1 vicryl suture. The subcutaneous tissue was closed with 2-0 vicryl continuous suture. The skin was closed with 3-0 vicryl subcuticular suture. All sponge, needle, and instrument counts were reported correct at the end of the case. Dry sterile dressings were placed over the incisions and around the chest tubes which were connected to pleurevac suction. The patient was then transported to the surgical intensive care unit in critical but stable condition.

## 2019-08-05 ENCOUNTER — Encounter (HOSPITAL_COMMUNITY): Payer: Self-pay | Admitting: Surgery

## 2019-08-05 ENCOUNTER — Inpatient Hospital Stay (HOSPITAL_COMMUNITY): Payer: Medicare PPO

## 2019-08-05 DIAGNOSIS — Z951 Presence of aortocoronary bypass graft: Secondary | ICD-10-CM | POA: Diagnosis not present

## 2019-08-05 DIAGNOSIS — I2510 Atherosclerotic heart disease of native coronary artery without angina pectoris: Secondary | ICD-10-CM | POA: Diagnosis not present

## 2019-08-05 DIAGNOSIS — I2119 ST elevation (STEMI) myocardial infarction involving other coronary artery of inferior wall: Secondary | ICD-10-CM | POA: Diagnosis not present

## 2019-08-05 DIAGNOSIS — I469 Cardiac arrest, cause unspecified: Secondary | ICD-10-CM | POA: Diagnosis not present

## 2019-08-05 LAB — GLUCOSE, CAPILLARY
Glucose-Capillary: 101 mg/dL — ABNORMAL HIGH (ref 70–99)
Glucose-Capillary: 105 mg/dL — ABNORMAL HIGH (ref 70–99)
Glucose-Capillary: 106 mg/dL — ABNORMAL HIGH (ref 70–99)
Glucose-Capillary: 108 mg/dL — ABNORMAL HIGH (ref 70–99)
Glucose-Capillary: 109 mg/dL — ABNORMAL HIGH (ref 70–99)
Glucose-Capillary: 120 mg/dL — ABNORMAL HIGH (ref 70–99)
Glucose-Capillary: 121 mg/dL — ABNORMAL HIGH (ref 70–99)
Glucose-Capillary: 135 mg/dL — ABNORMAL HIGH (ref 70–99)
Glucose-Capillary: 136 mg/dL — ABNORMAL HIGH (ref 70–99)
Glucose-Capillary: 145 mg/dL — ABNORMAL HIGH (ref 70–99)
Glucose-Capillary: 174 mg/dL — ABNORMAL HIGH (ref 70–99)
Glucose-Capillary: 183 mg/dL — ABNORMAL HIGH (ref 70–99)
Glucose-Capillary: 91 mg/dL (ref 70–99)
Glucose-Capillary: 98 mg/dL (ref 70–99)

## 2019-08-05 LAB — BASIC METABOLIC PANEL
Anion gap: 8 (ref 5–15)
Anion gap: 10 (ref 5–15)
BUN: 13 mg/dL (ref 8–23)
BUN: 15 mg/dL (ref 8–23)
CO2: 21 mmol/L — ABNORMAL LOW (ref 22–32)
CO2: 21 mmol/L — ABNORMAL LOW (ref 22–32)
Calcium: 7.7 mg/dL — ABNORMAL LOW (ref 8.9–10.3)
Calcium: 7.8 mg/dL — ABNORMAL LOW (ref 8.9–10.3)
Chloride: 110 mmol/L (ref 98–111)
Chloride: 107 mmol/L (ref 98–111)
Creatinine, Ser: 1.54 mg/dL — ABNORMAL HIGH (ref 0.61–1.24)
Creatinine, Ser: 1.7 mg/dL — ABNORMAL HIGH (ref 0.61–1.24)
GFR calc Af Amer: 51 mL/min — ABNORMAL LOW (ref >60)
GFR calc Af Amer: 45 mL/min — ABNORMAL LOW (ref >60)
GFR calc non Af Amer: 44 mL/min — ABNORMAL LOW (ref >60)
GFR calc non Af Amer: 39 mL/min — ABNORMAL LOW (ref >60)
Glucose, Bld: 101 mg/dL — ABNORMAL HIGH (ref 70–99)
Glucose, Bld: 210 mg/dL — ABNORMAL HIGH (ref 70–99)
Potassium: 4 mmol/L (ref 3.5–5.1)
Potassium: 4 mmol/L (ref 3.5–5.1)
Sodium: 139 mmol/L (ref 135–145)
Sodium: 138 mmol/L (ref 135–145)

## 2019-08-05 LAB — CBC
HCT: 30 % — ABNORMAL LOW (ref 39.0–52.0)
HCT: 29.8 % — ABNORMAL LOW (ref 39.0–52.0)
Hemoglobin: 10.1 g/dL — ABNORMAL LOW (ref 13.0–17.0)
Hemoglobin: 9.9 g/dL — ABNORMAL LOW (ref 13.0–17.0)
MCH: 30.1 pg (ref 26.0–34.0)
MCH: 30.2 pg (ref 26.0–34.0)
MCHC: 33.7 g/dL (ref 30.0–36.0)
MCHC: 33.2 g/dL (ref 30.0–36.0)
MCV: 89.6 fL (ref 80.0–100.0)
MCV: 90.9 fL (ref 80.0–100.0)
Platelets: 120 10*3/uL — ABNORMAL LOW (ref 150–400)
Platelets: 107 10*3/uL — ABNORMAL LOW (ref 150–400)
RBC: 3.35 MIL/uL — ABNORMAL LOW (ref 4.22–5.81)
RBC: 3.28 MIL/uL — ABNORMAL LOW (ref 4.22–5.81)
RDW: 14.2 % (ref 11.5–15.5)
RDW: 14.5 % (ref 11.5–15.5)
WBC: 13.2 10*3/uL — ABNORMAL HIGH (ref 4.0–10.5)
WBC: 11.7 10*3/uL — ABNORMAL HIGH (ref 4.0–10.5)
nRBC: 0 % (ref 0.0–0.2)
nRBC: 0 % (ref 0.0–0.2)

## 2019-08-05 LAB — MAGNESIUM
Magnesium: 2.5 mg/dL — ABNORMAL HIGH (ref 1.7–2.4)
Magnesium: 2.4 mg/dL (ref 1.7–2.4)

## 2019-08-05 MED ORDER — ATORVASTATIN CALCIUM 80 MG PO TABS
80.0000 mg | ORAL_TABLET | Freq: Every day | ORAL | Status: DC
  Administered 2019-08-05 – 2019-08-11 (×7): 80 mg via ORAL
  Filled 2019-08-05 (×7): qty 1

## 2019-08-05 MED ORDER — AMIODARONE HCL IN DEXTROSE 360-4.14 MG/200ML-% IV SOLN
60.0000 mg/h | INTRAVENOUS | Status: AC
  Administered 2019-08-05: 60 mg/h via INTRAVENOUS

## 2019-08-05 MED ORDER — INSULIN ASPART 100 UNIT/ML ~~LOC~~ SOLN: 0.0000 [IU] | SUBCUTANEOUS | Status: DC

## 2019-08-05 MED ORDER — AMIODARONE HCL IN DEXTROSE 360-4.14 MG/200ML-% IV SOLN
30.0000 mg/h | INTRAVENOUS | Status: DC
  Administered 2019-08-06 – 2019-08-07 (×4): 30 mg/h via INTRAVENOUS
  Filled 2019-08-05 (×5): qty 200

## 2019-08-05 MED ORDER — FUROSEMIDE 10 MG/ML IJ SOLN: 40.0000 mg | Freq: Two times a day (BID) | INTRAMUSCULAR | Status: DC

## 2019-08-05 MED ORDER — AMIODARONE HCL IN DEXTROSE 360-4.14 MG/200ML-% IV SOLN
INTRAVENOUS | Status: AC
  Filled 2019-08-05: qty 200

## 2019-08-05 MED ORDER — TRAMADOL HCL 50 MG PO TABS
50.0000 mg | ORAL_TABLET | ORAL | Status: DC | PRN
  Administered 2019-08-06 – 2019-08-07 (×3): 50 mg via ORAL
  Filled 2019-08-05 (×3): qty 1

## 2019-08-05 MED ORDER — ENOXAPARIN SODIUM 40 MG/0.4ML ~~LOC~~ SOLN
40.0000 mg | Freq: Every day | SUBCUTANEOUS | Status: DC
  Administered 2019-08-05 – 2019-08-07 (×3): 40 mg via SUBCUTANEOUS
  Filled 2019-08-05 (×3): qty 0.4

## 2019-08-05 MED ORDER — INSULIN ASPART 100 UNIT/ML ~~LOC~~ SOLN
0.0000 [IU] | SUBCUTANEOUS | Status: DC
  Administered 2019-08-05 (×2): 2 [IU] via SUBCUTANEOUS
  Administered 2019-08-05 (×2): 4 [IU] via SUBCUTANEOUS
  Administered 2019-08-05 – 2019-08-07 (×8): 2 [IU] via SUBCUTANEOUS
  Administered 2019-08-07: 21:00:00 4 [IU] via SUBCUTANEOUS
  Administered 2019-08-07 – 2019-08-08 (×2): 2 [IU] via SUBCUTANEOUS

## 2019-08-05 MED ORDER — POTASSIUM CHLORIDE CRYS ER 20 MEQ PO TBCR: 20.0000 meq | EXTENDED_RELEASE_TABLET | Freq: Two times a day (BID) | ORAL | Status: DC

## 2019-08-05 MED FILL — Thrombin (Recombinant) For Soln 20000 Unit: CUTANEOUS | Qty: 1 | Status: AC
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
## 2019-08-05 NOTE — Progress Notes (Signed)
Progress Note  Patient Name: Paul Torres Date of Encounter: 08/05/2019  Primary Cardiologist: No primary care provider on file.   Subjective   Overnight, he was hypertensive and tachycardic.  He received a dose of IV Lopressor subsequently developed bradycardia and hypotension.  Though he feels okay now without chest pain or shortness of breath, he does have episodes of of bradycardia, hypertension and diaphoresis with it would begin pacing via VVI at 50 with improvement to clinical symptoms and objective findings.  During our assessment, he had about 2 episodes of bradycardia, hypotension, transient confusion which spontaneously resolved after VVI activated.  Inpatient Medications    Scheduled Meds: . acetaminophen  1,000 mg Oral Q6H   Or  . acetaminophen (TYLENOL) oral liquid 160 mg/5 mL  1,000 mg Per Tube Q6H  . aspirin EC  325 mg Oral Daily   Or  . aspirin  324 mg Per Tube Daily  . bisacodyl  10 mg Oral Daily   Or  . bisacodyl  10 mg Rectal Daily  . Chlorhexidine Gluconate Cloth  6 each Topical Daily  . docusate sodium  200 mg Oral Daily  . dorzolamide-timolol  1 drop Both Eyes BID  . insulin regular  0-10 Units Intravenous TID WC  . ketotifen  1 drop Both Eyes BID  . mouth rinse  15 mL Mouth Rinse BID  . metoprolol tartrate  12.5 mg Oral BID   Or  . metoprolol tartrate  12.5 mg Per Tube BID  . [START ON 08/06/2019] pantoprazole  40 mg Oral Daily  . sodium chloride flush  10-40 mL Intracatheter Q12H  . sodium chloride flush  3 mL Intravenous Q12H   Continuous Infusions: . sodium chloride 20 mL/hr at 08/05/19 0600  . sodium chloride    . sodium chloride Stopped (08/04/19 1716)  . albumin human 12.5 g (08/04/19 2305)  . cefUROXime (ZINACEF)  IV Stopped (08/04/19 2317)  . dexmedetomidine (PRECEDEX) IV infusion Stopped (08/05/19 0341)  . insulin 0.3 mL/hr at 08/05/19 0600  . lactated ringers    . lactated ringers 20 mL/hr at 08/05/19 0600  . lactated ringers     . nitroGLYCERIN Stopped (08/04/19 2302)  . phenylephrine (NEO-SYNEPHRINE) Adult infusion 25 mcg/min (08/05/19 0600)   PRN Meds: sodium chloride, albumin human, lactated ringers, metoprolol tartrate, midazolam, morphine injection, ondansetron (ZOFRAN) IV, oxyCODONE, sodium chloride flush, sodium chloride flush, traMADol   Vital Signs    Vitals:   08/05/19 0530 08/05/19 0545 08/05/19 0600 08/05/19 0615  BP: 125/62  125/66   Pulse: 89 89 88 88  Resp: Temp: 99.7 F (37.6 C) 99.7 F (37.6 C) 99.7 F (37.6 C) 99.5 F (37.5 C)  TempSrc:      SpO2: 100% 100% 99% 95%  Weight:      Height:        Intake/Output Summary (Last 24 hours) at 08/05/2019 0653 Last data filed at 08/05/2019 0600 Gross per 24 hour  Intake 5227.58 ml  Output 2205 ml  Net 3022.58 ml   Filed Weights   08/03/19 1200 08/05/19 0400  Weight: 73.5 kg 83.8 kg    Telemetry    Sinus tachycardia, bradycardia, transient episodes of asystole- Personally Reviewed  ECG    Sinus rhythm with occasional PVCs- Personally Reviewed  Physical Exam   GEN: No acute distress.   Cardiac: RRR, no murmurs, rubs, or gallops.  Respiratory: Clear to auscultation bilaterally. MS: No edema; No deformity. Neuro:  Nonfocal  Psych:  Normal affect   Labs    Chemistry Recent Labs  Lab 08/04/19 0235  08/04/19 1958 08/04/19 2017 08/05/19 0403  NA 139   < > 146* 141 139  K 3.7   < > 4.5 4.1 4.0  CL 107  --   --  112* 110  CO2 24  --   --  21* 21*  GLUCOSE 134*  --   --  127* 101*  BUN 12  --   --  10 13  CREATININE 1.57*  --   --  1.59* 1.54*  CALCIUM 8.1*  --   --  7.6* 7.7*  GFRNONAA 43*  --   --  42* 44*  GFRAA 50*  --   --  49* 51*  ANIONGAP 8  --   --  8 8   < > = values in this interval not displayed.     Hematology Recent Labs  Lab 08/04/19 1430  08/04/19 1958 08/04/19 2017 08/05/19 0403  WBC 10.8*  --   --  10.7* 13.2*  RBC 3.52*  --   --  3.72* 3.35*  HGB 10.8*   < > 10.5* 11.3* 10.1*   HCT 30.6*   < > 31.0* 32.7* 30.0*  MCV 86.9  --   --  87.9 89.6  MCH 30.7  --   --  30.4 30.1  MCHC 35.3  --   --  34.6 33.7  RDW 13.6  --   --  13.9 14.2  PLT 101*  --   --  120* 120*   < > = values in this interval not displayed.    Cardiac EnzymesNo results for input(s): TROPONINI in the last 168 hours. No results for input(s): TROPIPOC in the last 168 hours.   BNPNo results for input(s): BNP, PROBNP in the last 168 hours.   DDimer No results for input(s): DDIMER in the last 168 hours.   Radiology    Dg Chest Port 1 View  Result Date: 08/05/2019 CLINICAL DATA:  Chest tube.  CABG. EXAM: PORTABLE CHEST 1 VIEW COMPARISON:  08/04/2019. FINDINGS: Interim removal of endotracheal tube and NG tube. Swan-Ganz catheter, mediastinal drainage tube, and left chest tube in stable position. No pneumothorax. Prior CABG. Stable cardiomegaly. Persistent bibasilar atelectasis, left side greater than right. Left base infiltrate cannot be excluded. Tiny left pleural effusion cannot be excluded. Degenerative change thoracic spine. IMPRESSION: 1. Interim removal of endotracheal tube and NG tube. Swan-Ganz catheter, mediastinal drainage tube, left chest tube in stable position. No pneumothorax. 2.  Prior CABG.  Stable cardiomegaly. 3. Persistent bibasilar atelectasis, left side greater than right. Left base infiltrate cannot be excluded. Tiny left pleural effusion cannot be excluded. Electronically Signed   By: Maisie Fus  Register   On: 08/05/2019 06:04   Dg Chest Port 1 View  Result Date: 08/04/2019 CLINICAL DATA:  CABG. EXAM: PORTABLE CHEST 1 VIEW COMPARISON:  CT 08/02/2019.  Chest x-ray 08/02/2019. FINDINGS: Endotracheal tube noted with its tip 4 cm above the carina. NG tube noted with tip below left hemidiaphragm. Swan-Ganz catheter with tip in pulmonary outflow tract. Mediastinal drainage catheter noted over the mid mediastinum. Left chest tube noted over the left lower chest. No pneumothorax. Cardiomegaly.  No pulmonary venous congestion. Low lung volumes with bibasilar atelectasis. No prominent pleural effusion. IMPRESSION: 1. Lines and tubes including left chest tube in good anatomic position as above. No pneumothorax. 2. Prior CABG. Stable cardiomegaly. No pulmonary venous congestion. 3.  Bibasilar atelectasis. Electronically Signed  By: [place]   On: 08/04/2019 14:46   Dg Chest Port 1 View  Result Date: 08/03/2019 CLINICAL DATA:  Temporary pacemaker EXAM: PORTABLE CHEST 1 VIEW COMPARISON:  Portable exam 1525 hours compared to 08/02/2019 FINDINGS: Pacemaker via infra diaphragmatic approach projects over the RIGHT ventricle. External pacing leads are present. Enlargement of cardiac silhouette with pulmonary vascular congestion. Minimal perihilar edema greater on LEFT. Subsegmental atelectasis at LEFT base. No gross pleural effusion or pneumothorax. IMPRESSION: Pacemaker lead projects over RIGHT ventricle. Enlargement of cardiac silhouette with vascular congestion and minimal perihilar edema. Subsegmental atelectasis LEFT base. Electronically Signed   By: Lavonia Dana M.D.   On: 08/03/2019 15:57   Vas US Doppler Pre Cabg  Result Date: 08/04/2019 PREOPERATIVE VASCULAR EVALUATION  Indications:      Pre-surgical evaluation. Risk Factors:     Hypertension, hyperlipidemia, Diabetes. Comparison Study: No prior study. Performing Technologist: Maudry Mayhew MHA, RDMS, RVT, RDCS  Examination Guidelines: A complete evaluation includes B-mode imaging, spectral Doppler, color Doppler, and power Doppler as needed of all accessible portions of each vessel. Bilateral testing is considered an integral part of a complete examination. Limited examinations for reoccurring indications may be performed as noted. This study was performed with a priority of STAT, therefore portions of this exam may be limited.  Right Carotid Findings: +----------+--------+--------+--------+-----------------------+--------+            PSV cm/sEDV cm/sStenosisDescribe               Comments +----------+--------+--------+--------+-----------------------+--------+ CCA Prox  80      14                                              +----------+--------+--------+--------+-----------------------+--------+ CCA Distal49      13                                              +----------+--------+--------+--------+-----------------------+--------+ ICA Prox  90      22              smooth and heterogenous         +----------+--------+--------+--------+-----------------------+--------+ ICA Distal52      14                                              +----------+--------+--------+--------+-----------------------+--------+ ECA       139     14                                              +----------+--------+--------+--------+-----------------------+--------+ Portions of this table do not appear on this page. +----------+--------+-------+----------------+------------+           PSV cm/sEDV cmsDescribe        Arm Pressure +----------+--------+-------+----------------+------------+ Subclavian113            Multiphasic, WNL             +----------+--------+-------+----------------+------------+ +---------+--------+--+--------+--+---------+ VertebralPSV cm/s38EDV cm/s10Antegrade +---------+--------+--+--------+--+---------+ Left Carotid Findings: +----------+-------+-------+--------+---------------------------------+--------+           PSV  EDV    StenosisDescribe                         Comments           cm/s   cm/s                                                     +----------+-------+-------+--------+---------------------------------+--------+ CCA Prox  81     15                                                       +----------+-------+-------+--------+---------------------------------+--------+ CCA Distal55     13             smooth and heterogenous                    +----------+-------+-------+--------+---------------------------------+--------+ ICA Prox  48     14             irregular, heterogenous and                                               calcific                                  +----------+-------+-------+--------+---------------------------------+--------+ ICA Distal64     18                                                       +----------+-------+-------+--------+---------------------------------+--------+ ECA       83                    smooth                                    +----------+-------+-------+--------+---------------------------------+--------+ +----------+--------+--------+----------------+------------+ SubclavianPSV cm/sEDV cm/sDescribe        Arm Pressure +----------+--------+--------+----------------+------------+           72              Multiphasic, WNL140          +----------+--------+--------+----------------+------------+ +---------+--------+--+--------+--+---------+ VertebralPSV cm/s82EDV cm/s25Antegrade +---------+--------+--+--------+--+---------+  ABI Findings: +--------+------------------+-----+---------+--------+ Right   Rt Pressure (mmHg)IndexWaveform Comment  +--------+------------------+-----+---------+--------+ Brachial                       biphasic          +--------+------------------+-----+---------+--------+ PTA                            biphasic          +--------+------------------+-----+---------+--------+ DP  triphasic         +--------+------------------+-----+---------+--------+ +--------+------------------+-----+--------+-------+ Left    Lt Pressure (mmHg)IndexWaveformComment +--------+------------------+-----+--------+-------+ Brachial140                    biphasic        +--------+------------------+-----+--------+-------+ PTA                            biphasic         +--------+------------------+-----+--------+-------+ DP                             biphasic        +--------+------------------+-----+--------+-------+  Right Doppler Findings: +-----------+--------+-----+--------+-----------------------------------------+ Site       PressureIndexDoppler Comments                                  +-----------+--------+-----+--------+-----------------------------------------+ Brachial                biphasic                                          +-----------+--------+-----+--------+-----------------------------------------+ Radial                  biphasic                                          +-----------+--------+-----+--------+-----------------------------------------+ Ulnar                           Unable to insonate due to position        +-----------+--------+-----+--------+-----------------------------------------+ Palmar Arch                     Unable to evaluate due to TR band                                         placement.                                +-----------+--------+-----+--------+-----------------------------------------+  Left Doppler Findings: +-----------+--------+-----+--------+------------------------------------------+ Site       PressureIndexDoppler Comments                                   +-----------+--------+-----+--------+------------------------------------------+ Brachial   140          biphasic                                           +-----------+--------+-----+--------+------------------------------------------+ Palmar Arch                     Signal is unaffected with radial  compression, decreases >50% with ulnar                                     compression.                               +-----------+--------+-----+--------+------------------------------------------+  Summary: Right Carotid: Velocities in the right ICA  are consistent with a 1-39% stenosis. Left Carotid: Velocities in the left ICA are consistent with a 1-39% stenosis. Vertebrals:  Bilateral vertebral arteries demonstrate antegrade flow. Subclavians: Left subclavian artery was not visualized. Normal flow hemodynamics              were seen in the right subclavian artery.  Electronically signed by Lemar Livings MD on 08/04/2019 at 4:31:12 PM.    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  Total occlusion of the right coronary filling late by collaterals from the left coronary.  Widely patent left main. Unable to determine if the left main courses between the pulmonary artery and aorta.   95 to 99% proximal LAD stenosis. Mid to distal LAD is diffusely diseased up to 80% and may be intramyocardial.  A large ramus intermedius contains proximal 70 to 80% stenosis.  Circumflex distribution is supplied by a vessel that has segmental 90% calcified stenosis before branching onto the lateral wall.  The distal circumflex territory is not well visualized and appears to be from a retroaortic vessel arising from the right coronary. No significant branches are noted on the left inferolateral wall.  LVEDP was normal. Echocardiogram done earlier today suggests inferior wall akinesis. EF 60%.  Successful placement of temporary transvenous pacemaker from right femoral vein. High threshold.   Patient Profile     73 y.o. male with type 2 diabetes mellitus, hypertension, dyslipidemia, gout, prior tobacco  use disorder who initially presented to [facility] following a syncopal episode with EKG and echocardiogram findings concerning for RCA infarct as EKG showed inferior wall infarction and echocardiogram revealing inferior wall dyskinesis. He was then transferred to [facility] for [facility].  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  He is doing well postoperatively with exception of transient episodes of bradycardia, hypotension and asystole that improves once VVI pacer is activated -Continue aspirin, metoprolol, Lipitor -Management per CVTS  #Bradycardia He has brief episodes of bradycardia, asystole and hypotension which clinically would manifest as fluid become transiently confused however improves once VVI pacer is activated.  CVTS and EP is aware of these episodes.  He will most likely require a permanent pacemaker if he does not fully recover. -Continue VVI pacer -Appreciate assistance from EP  #Type 2 diabetes mellitus Hemoglobin A1c of 5.6% -Continue SSI  #Hyperlipidemia LDL 32 -Continue Lipitor  #Hypertension Was noted to be in sinus  tachycardia overnight and received as needed beta-blocker however had an episode of hypotension with SBP in the 70s.  This morning blood pressure is stable at 120s/60s with pulse of 88. -Discontinue beta-blocker -On low-dose phenylephrine due to episode of hypotension last night  #?CKD stage III Serum creatinine has been elevated during this admission though his baseline is unknown.  On arrival sCr was 1.8 >>1.5 -Continue to monitor -Avoid nephrotoxic agents      For questions or updates, please contact [facility] Please consult [email or web address] for contact info under Cardiology/STEMI.      Signed,  Yvette Rackbed K Imir Brumbach, MD  08/05/2019, 6:53 AM

## 2019-08-05 NOTE — Progress Notes (Signed)
EVENING ROUNDS NOTE :     [address]       [place] [address]             [phone]                 1 Day Post-Op Procedure(s) (LRB): CORONARY ARTERY BYPASS GRAFTING (CABG) x 5 WITH ENDOSCOPIC HARVESTING OF RIGHT GREATER SAPHENOUS VEIN. LIMA TO LAD (N/A) TRANSESOPHAGEAL ECHOCARDIOGRAM (TEE) (N/A)   Total Length of Stay:  LOS: 2 days  Events:  Continues to pace at 4 Ambulated today. Good hemodynamics    BP 139/61   Pulse 86   Temp 98.8 F (37.1 C)   Resp 15   Ht 5\' 10"  (1.778 m)   Wt 83.8 kg   SpO2 95%   BMI 26.50 kg/m   PAP: (26-269)/(8-254) 40/10 CO:  [4.2 L/min-5.4 L/min] 4.5 L/min CI:  [2.2 L/min/m2-2.8 L/min/m2] 2.4 L/min/m2  Vent Mode: CPAP;PSV FiO2 (%):  [40 %] 40 % Set Rate:  [4 bmp] 4 bmp PEEP:  [5 cmH20] 5 cmH20 Pressure Support:  [10 cmH20] 10 cmH20  . sodium chloride Stopped (08/05/19 0951)  . sodium chloride    . sodium chloride 10 mL/hr at 08/05/19 1300  . cefUROXime (ZINACEF)  IV Stopped (08/05/19 1252)  . lactated ringers 20 mL/hr at 08/05/19 1300  . lactated ringers    . nitroGLYCERIN Stopped (08/04/19 2302)  . phenylephrine (NEO-SYNEPHRINE) Adult infusion Stopped (08/05/19 1031)    I/O last 3 completed shifts: In: 6668.7 [P.O.:870; I.V.:4053; Blood:300; IV Piggyback:1445.8] Out: 7106 [Urine:2535; Chest Tube:520]   CBC Latest Ref Rng & Units 08/05/2019 08/05/2019 08/04/2019  WBC 4.0 - 10.5 K/uL 11.7(H) 13.2(H) 10.7(H)  Hemoglobin 13.0 - 17.0 g/dL 9.9(L) 10.1(L) 11.3(L)  Hematocrit 39.0 - 52.0 % 29.8(L) 30.0(L) 32.7(L)  Platelets 150 - 400 K/uL PENDING 120(L) 120(L)    BMP Latest Ref Rng & Units 08/05/2019 08/05/2019 08/04/2019  Glucose 70 - 99 mg/dL 210(H) 101(H) 127(H)  BUN 8 - 23 mg/dL 15 13 10   Creatinine 0.61 - 1.24 mg/dL 1.70(H) 1.54(H) 1.59(H)  Sodium 135 - 145 mmol/L 138 139 141  Potassium 3.5 - 5.1 mmol/L 4.0 4.0 4.1  Chloride 98 - 111 mmol/L 107 110 112(H)  CO2 22 - 32 mmol/L 21(L) 21(L) 21(L)   Calcium 8.9 - 10.3 mg/dL 7.8(L) 7.7(L) 7.6(L)    ABG    Component Value Date/Time   PHART 7.323 (L) 08/04/2019 1958   PCO2ART 42.7 08/04/2019 1958   PO2ART 136.0 (H) 08/04/2019 1958   HCO3 21.9 08/04/2019 1958   TCO2 23 08/04/2019 1958   ACIDBASEDEF 4.0 (H) 08/04/2019 1958   O2SAT 99.0 08/04/2019 1958       Paul Bouillon, MD 08/05/2019 4:29 PM

## 2019-08-05 NOTE — Anesthesia Postprocedure Evaluation (Signed)
Anesthesia Post Note  Patient: Paul Torres  Procedure(s) Performed: CORONARY ARTERY BYPASS GRAFTING (CABG) x 5 WITH ENDOSCOPIC HARVESTING OF RIGHT GREATER SAPHENOUS VEIN. LIMA TO LAD (N/A Chest) TRANSESOPHAGEAL ECHOCARDIOGRAM (TEE) (N/A )     Patient location during evaluation: SICU Anesthesia Type: General Level of consciousness: awake Pain management: pain level controlled Vital Signs Assessment: post-procedure vital signs reviewed and stable Respiratory status: spontaneous breathing Cardiovascular status: stable Postop Assessment: no apparent nausea or vomiting Anesthetic complications: no    Last Vitals:  Vitals:   08/05/19 0715 08/05/19 0730  BP:  (!) 132/57  Pulse: 80 92  Resp: 12 12  Temp: 37.3 C 37.3 C  SpO2: 92% 91%    Last Pain:  Vitals:   08/05/19 0928  TempSrc:   PainSc: [place]

## 2019-08-05 NOTE — Progress Notes (Signed)
This RN walked by room. Noticed monitor was alarming "ART disconnect". Arterial line was found to have been removed by patient and it was laying in the bed. Manual pressure held and pressure dressing applied. VSS. Patient educated.

## 2019-08-05 NOTE — Progress Notes (Signed)
1 Day Post-Op Procedure(s) (LRB): CORONARY ARTERY BYPASS GRAFTING (CABG) x 5 WITH ENDOSCOPIC HARVESTING OF RIGHT GREATER SAPHENOUS VEIN. LIMA TO LAD (N/A) TRANSESOPHAGEAL ECHOCARDIOGRAM (TEE) (N/A) Subjective: Feels ok, no complaints.   Objective: Vital signs in last 24 hours: Temp:  [98.2 F (36.8 C)-100.6 F (38.1 C)] 99.1 F (37.3 C) (10/30 0730) Pulse Rate:  [59-118] 92 (10/30 0730) Cardiac Rhythm: Normal sinus rhythm (10/30 0400) Resp:  [12-29] 12 (10/30 0730) BP: (91-135)/(52-80) 132/57 (10/30 0730) SpO2:  [91 %-100 %] 91 % (10/30 0730) Arterial Line BP: (65-177)/(23-67) 141/47 (10/30 0730) FiO2 (%):  [40 %-50 %] 40 % (10/29 1803) Weight:  [83.8 kg] 83.8 kg (10/30 0400)  Hemodynamic parameters for last 24 hours: PAP: (26-269)/(12-254) 49/17 CO:  [3.7 L/min-5.4 L/min] 4.8 L/min CI:  [2 L/min/m2-2.8 L/min/m2] 2.5 L/min/m2  Intake/Output from previous day: 10/29 0701 - 10/30 0700 In: 5529.8 [P.O.:750; I.V.:3034.1; Blood:300; IV Piggyback:1445.8] Out: 2205 [Urine:1685; Chest Tube:520] Intake/Output this shift: No intake/output data recorded.  General appearance: alert and cooperative Neurologic: intact Heart: regular rate and rhythm, S1, S2 normal, no murmur, click, rub or gallop Lungs: clear to auscultation bilaterally Extremities: edema mild Wound: dressings dry  Lab Results: Recent Labs    08/04/19 2017 08/05/19 0403  WBC 10.7* 13.2*  HGB 11.3* 10.1*  HCT 32.7* 30.0*  PLT 120* 120*   BMET:  Recent Labs    08/04/19 2017 08/05/19 0403  NA 141 139  K 4.1 4.0  CL 112* 110  CO2 21* 21*  GLUCOSE 127* 101*  BUN 10 13  CREATININE 1.59* 1.54*  CALCIUM 7.6* 7.7*    PT/INR:  Recent Labs    08/04/19 1430  LABPROT 17.5*  INR 1.5*   ABG    Component Value Date/Time   PHART 7.323 (L) 08/04/2019 1958   HCO3 21.9 08/04/2019 1958   TCO2 23 08/04/2019 1958   ACIDBASEDEF 4.0 (H) 08/04/2019 1958   O2SAT 99.0 08/04/2019 1958   CBG (last 3)  Recent Labs     08/05/19 0507 08/05/19 0607 08/05/19 0721  GLUCAP 91 101* 106*   CXR: left basilar atelectasis  ECG: sinus with no heart block and no acute changes  Assessment/Plan: S/P Procedure(s) (LRB): CORONARY ARTERY BYPASS GRAFTING (CABG) x 5 WITH ENDOSCOPIC HARVESTING OF RIGHT GREATER SAPHENOUS VEIN. LIMA TO LAD (N/A) TRANSESOPHAGEAL ECHOCARDIOGRAM (TEE) (N/A)  POD 1 Hemodynamically stable in sinus rhythm. He had recurrent bouts of asystole and syncope preop of unclear etiology. Seen by EP. Overnight was reportedly hypertensive and tachycardic and received a dose of IV Lopressor with marked bradycardia and began pacing. This am I switched him to VVI 50 and he had an episode of marked brady or asysole and began pacing at 50 with drop in BP and felt bad. I think he is going to need a PPM. Will keep pacer on VVI 50 and monitor. If he has frequent episodes of this will change to DDD 80.  DC chest tubes, swan, arterial line.  Diurese as BP allows.  OOB, IS.  Glucose under good control. DC drip and start SSI today. preop Hgb A1c was 5.8 so can stop CBG's and SSI tomorrow if glucose remains within normal range.   LOS: 2 days    Gaye Pollack 08/05/2019

## 2019-08-05 NOTE — Progress Notes (Signed)
At 2115 assisted pt to the chair with minimal assistance. Noted to have HR jumping into the 130s, irregular with no visible p wave. Charge RN also observed pt's telemetry and verified that pt was now in a fib. Pacemaker settings changed to VVI pacing and MD on call paged. Orders given for amiodarone gtt to be started. Condition and treatment explained to pt who verbalized understanding. Pt assisted back to bed with minimal assistance. Will continue to monitor.

## 2019-08-05 NOTE — Discharge Instructions (Signed)

## 2019-08-05 NOTE — Progress Notes (Addendum)
  Discussed with Dr. Ellyn Hack and Dr. Curt Bears.  We are following along for need for PPM s/p CABG with significant pause before surgery, and continued intermittent requirement of pacing.   Please call with any questions.   Legrand Como [address]" [place], PA-C  08/05/2019

## 2019-08-06 ENCOUNTER — Inpatient Hospital Stay (HOSPITAL_COMMUNITY): Payer: Medicare PPO

## 2019-08-06 DIAGNOSIS — Z951 Presence of aortocoronary bypass graft: Secondary | ICD-10-CM | POA: Diagnosis not present

## 2019-08-06 DIAGNOSIS — I495 Sick sinus syndrome: Secondary | ICD-10-CM

## 2019-08-06 LAB — CBC
HCT: 28.7 % — ABNORMAL LOW (ref 39.0–52.0)
Hemoglobin: 9.5 g/dL — ABNORMAL LOW (ref 13.0–17.0)
MCH: 30.4 pg (ref 26.0–34.0)
MCHC: 33.1 g/dL (ref 30.0–36.0)
MCV: 91.7 fL (ref 80.0–100.0)
Platelets: 97 10*3/uL — ABNORMAL LOW (ref 150–400)
RBC: 3.13 MIL/uL — ABNORMAL LOW (ref 4.22–5.81)
RDW: 14.6 % (ref 11.5–15.5)
WBC: 9.9 10*3/uL (ref 4.0–10.5)
nRBC: 0 % (ref 0.0–0.2)

## 2019-08-06 LAB — BASIC METABOLIC PANEL
Anion gap: 9 (ref 5–15)
BUN: 17 mg/dL (ref 8–23)
CO2: 23 mmol/L (ref 22–32)
Calcium: 7.9 mg/dL — ABNORMAL LOW (ref 8.9–10.3)
Chloride: 104 mmol/L (ref 98–111)
Creatinine, Ser: 1.63 mg/dL — ABNORMAL HIGH (ref 0.61–1.24)
GFR calc Af Amer: 48 mL/min — ABNORMAL LOW (ref >60)
GFR calc non Af Amer: 41 mL/min — ABNORMAL LOW (ref >60)
Glucose, Bld: 151 mg/dL — ABNORMAL HIGH (ref 70–99)
Potassium: 3.9 mmol/L (ref 3.5–5.1)
Sodium: 136 mmol/L (ref 135–145)

## 2019-08-06 LAB — GLUCOSE, CAPILLARY
Glucose-Capillary: 140 mg/dL — ABNORMAL HIGH (ref 70–99)
Glucose-Capillary: 128 mg/dL — ABNORMAL HIGH (ref 70–99)
Glucose-Capillary: 116 mg/dL — ABNORMAL HIGH (ref 70–99)
Glucose-Capillary: 115 mg/dL — ABNORMAL HIGH (ref 70–99)
Glucose-Capillary: 128 mg/dL — ABNORMAL HIGH (ref 70–99)
Glucose-Capillary: 126 mg/dL — ABNORMAL HIGH (ref 70–99)

## 2019-08-06 MED ORDER — FUROSEMIDE 10 MG/ML IJ SOLN
40.0000 mg | Freq: Once | INTRAMUSCULAR | Status: AC
  Administered 2019-08-06: 40 mg via INTRAVENOUS
  Filled 2019-08-06: qty 4

## 2019-08-06 NOTE — Progress Notes (Signed)
Progress Note  Patient Name: Paul Torres Date of Encounter: 08/06/2019  Primary Cardiologist: New  Subjective   No chest pain or sob.  Inpatient Medications    Scheduled Meds: . acetaminophen  1,000 mg Oral Q6H   Or  . acetaminophen (TYLENOL) oral liquid 160 mg/5 mL  1,000 mg Per Tube Q6H  . aspirin EC  325 mg Oral Daily   Or  . aspirin  324 mg Per Tube Daily  . atorvastatin  80 mg Oral q1800  . bisacodyl  10 mg Oral Daily   Or  . bisacodyl  10 mg Rectal Daily  . Chlorhexidine Gluconate Cloth  6 each Topical Daily  . docusate sodium  200 mg Oral Daily  . dorzolamide-timolol  1 drop Both Eyes BID  . enoxaparin (LOVENOX) injection  40 mg Subcutaneous QHS  . insulin aspart  0-24 Units Subcutaneous Q4H  . ketotifen  1 drop Both Eyes BID  . mouth rinse  15 mL Mouth Rinse BID  . pantoprazole  40 mg Oral Daily  . sodium chloride flush  10-40 mL Intracatheter Q12H  . sodium chloride flush  3 mL Intravenous Q12H   Continuous Infusions: . sodium chloride Stopped (08/05/19 0951)  . sodium chloride    . sodium chloride 10 mL/hr at 08/06/19 0100  . amiodarone 30 mg/hr (08/06/19 0345)  . cefUROXime (ZINACEF)  IV 1.5 g (08/05/19 2321)  . lactated ringers 20 mL/hr at 08/06/19 0100  . lactated ringers    . nitroGLYCERIN Stopped (08/04/19 2302)  . phenylephrine (NEO-SYNEPHRINE) Adult infusion Stopped (08/05/19 1031)   PRN Meds: sodium chloride, morphine injection, ondansetron (ZOFRAN) IV, oxyCODONE, sodium chloride flush, sodium chloride flush, traMADol   Vital Signs    Vitals:   08/06/19 0346 08/06/19 0400 08/06/19 0500 08/06/19 0619  BP:  126/65 113/62   Pulse:  89 88 89  Resp:  16 15 17   Temp: 98.3 F (36.8 C)     TempSrc: Oral     SpO2:  100% 100% 96%  Weight:    84 kg  Height:        Intake/Output Summary (Last 24 hours) at 08/06/2019 0717 Last data filed at 08/06/2019 0500 Gross per 24 hour  Intake 989.84 ml  Output 625 ml  Net 364.84 ml   Last 3  Weights 08/06/2019 08/05/2019 08/03/2019  Weight (lbs) 185 lb 3.2 oz 184 lb 11.2 oz 162 lb  Weight (kg) 84.006 kg 83.779 kg 73.483 kg      Telemetry    Ventricular pacing and intrinsic conduction both present - Personally Reviewed  ECG    none - Personally Reviewed  Physical Exam   GEN: No acute distress.   Neck: No JVD Cardiac: IRRR, no murmurs, rubs, or gallops. Median sternotomy is present. Respiratory: Clear to auscultation bilaterally. GI: Soft, nontender, non-distended  MS: No edema; No deformity. Neuro:  Nonfocal  Psych: Normal affect   Labs    High Sensitivity Troponin:  No results for input(s): TROPONINIHS in the last 720 hours.    Chemistry Recent Labs  Lab 08/05/19 0403 08/05/19 1513 08/06/19 0346  NA 139 138 136  K 4.0 4.0 3.9  CL 110 107 104  CO2 21* 21* 23  GLUCOSE 101* 210* 151*  BUN 13 15 17   CREATININE 1.54* 1.70* 1.63*  CALCIUM 7.7* 7.8* 7.9*  GFRNONAA 44* 39* 41*  GFRAA 51* 45* 48*  ANIONGAP 8 10 9      Hematology Recent Labs  Lab 08/05/19 0403  08/05/19 1513 08/06/19 0346  WBC 13.2* 11.7* 9.9  RBC 3.35* 3.28* 3.13*  HGB 10.1* 9.9* 9.5*  HCT 30.0* 29.8* 28.7*  MCV 89.6 90.9 91.7  MCH 30.1 30.2 30.4  MCHC 33.7 33.2 33.1  RDW 14.2 14.5 14.6  PLT 120* 107* 97*    BNPNo results for input(s): BNP, PROBNP in the last 168 hours.   DDimer No results for input(s): DDIMER in the last 168 hours.   Radiology    Dg Chest Port 1 View  Result Date: 08/05/2019 CLINICAL DATA:  Chest tube.  CABG. EXAM: PORTABLE CHEST 1 VIEW COMPARISON:  08/04/2019. FINDINGS: Interim removal of endotracheal tube and NG tube. Swan-Ganz catheter, mediastinal drainage tube, and left chest tube in stable position. No pneumothorax. Prior CABG. Stable cardiomegaly. Persistent bibasilar atelectasis, left side greater than right. Left base infiltrate cannot be excluded. Tiny left pleural effusion cannot be excluded. Degenerative change thoracic spine. IMPRESSION: 1.  Interim removal of endotracheal tube and NG tube. Swan-Ganz catheter, mediastinal drainage tube, left chest tube in stable position. No pneumothorax. 2.  Prior CABG.  Stable cardiomegaly. 3. Persistent bibasilar atelectasis, left side greater than right. Left base infiltrate cannot be excluded. Tiny left pleural effusion cannot be excluded. Electronically Signed   By: Marcello Moores  Register   On: 08/05/2019 06:04   Dg Chest Port 1 View  Result Date: 08/04/2019 CLINICAL DATA:  CABG. EXAM: PORTABLE CHEST 1 VIEW COMPARISON:  CT 08/02/2019.  Chest x-ray 08/02/2019. FINDINGS: Endotracheal tube noted with its tip 4 cm above the carina. NG tube noted with tip below left hemidiaphragm. Swan-Ganz catheter with tip in pulmonary outflow tract. Mediastinal drainage catheter noted over the mid mediastinum. Left chest tube noted over the left lower chest. No pneumothorax. Cardiomegaly. No pulmonary venous congestion. Low lung volumes with bibasilar atelectasis. No prominent pleural effusion. IMPRESSION: 1. Lines and tubes including left chest tube in good anatomic position as above. No pneumothorax. 2. Prior CABG. Stable cardiomegaly. No pulmonary venous congestion. 3.  Bibasilar atelectasis. Electronically Signed   By: Marcello Moores  Register   On: 08/04/2019 14:46     Patient Profile     Paul Torres is a 73 y.o. male with a history of type 2 diabetes mellitus, hypertension, dyslipidemia, gout, prior tobacco use disorder who initially presented to [facility] following a syncopal episode with EKG and echocardiogram findings concerning for RCA infarct as EKG showed inferior wall infarction and echocardiogram revealing inferior wall dyskinesis.  He was then transferred to [facility] for [place]    1  CAD  - Pt Day 2 from CABG x 5  And doing well from this perspective  2 . Bradycardia - I cannot tell if his rhythm is NSR but rates remain slow at times with intrinsic conduction coming in at  times with ventricular pacing. I think he probably still needs some rate support but once he is a few days out, would turn pacing rate down to see if he conducts on his own and whether he has much in the way of pauses. Decision on pacemaker on hold.   For questions or updates, please contact [place] Please consult [email or web address] for contact info under   Signed, Cristopher Peru, MD  08/06/2019, 7:17 AM

## 2019-08-06 NOTE — Progress Notes (Signed)
      [address]       [place] [address]             [phone]                 2 Days Post-Op Procedure(s) (LRB): CORONARY ARTERY BYPASS GRAFTING (CABG) x 5 WITH ENDOSCOPIC HARVESTING OF RIGHT GREATER SAPHENOUS VEIN. LIMA TO LAD (N/A) TRANSESOPHAGEAL ECHOCARDIOGRAM (TEE) (N/A)   Events: afib early this am.  Placed on amio drip Now being paced at 90 _______________________________________________________________ Vitals: BP (!) 141/65 (BP Location: Left Arm)   Pulse 89   Temp 98.3 F (36.8 C) (Oral)   Resp 17   Ht 5\' 10"  (1.778 m)   Wt 84 kg   SpO2 100%   BMI 26.57 kg/m   - Neuro: alert NAD  - Cardiovascular: mostly V paced at 90.  Pacer set to DDD  Drips: amio.     - Pulm: EWOB on [place]    ABG    Component Value Date/Time   PHART 7.323 (L) 08/04/2019 1958   PCO2ART 42.7 08/04/2019 1958   PO2ART 136.0 (H) 08/04/2019 1958   HCO3 21.9 08/04/2019 1958   TCO2 23 08/04/2019 1958   ACIDBASEDEF 4.0 (H) 08/04/2019 1958   O2SAT 99.0 08/04/2019 1958    - Abd: soft ND, NT - Extremity: trace  .Intake/Output      10/30 0701 - 10/31 0700 10/31 0701 - 11/01 0700   P.O.     I.V. (mL/kg) 889.8 (10.6) 108 (1.3)   Blood     Other     IV Piggyback 100    Total Intake(mL/kg) 989.8 (11.8) 108 (1.3)   Urine (mL/kg/hr) 585 (0.3) 80 (0.4)   Chest Tube 40    Total Output 625 80   Net +364.8 +28           _______________________________________________________________ Labs: CBC Latest Ref Rng & Units 08/06/2019 08/05/2019 08/05/2019  WBC 4.0 - 10.5 K/uL 9.9 11.7(H) 13.2(H)  Hemoglobin 13.0 - 17.0 g/dL 9.5(L) 9.9(L) 10.1(L)  Hematocrit 39.0 - 52.0 % 28.7(L) 29.8(L) 30.0(L)  Platelets 150 - 400 K/uL 97(L) 107(L) 120(L)   CMP Latest Ref Rng & Units 08/06/2019 08/05/2019 08/05/2019  Glucose 70 - 99 mg/dL 151(H) 210(H) 101(H)  BUN 8 - 23 mg/dL 17 15 13   Creatinine 0.61 - 1.24 mg/dL 1.63(H) 1.70(H) 1.54(H)  Sodium 135 - 145 mmol/L 136 138 139   Potassium 3.5 - 5.1 mmol/L 3.9 4.0 4.0  Chloride 98 - 111 mmol/L 104 107 110  CO2 22 - 32 mmol/L 23 21(L) 21(L)  Calcium 8.9 - 10.3 mg/dL 7.9(L) 7.8(L) 7.7(L)    CXR: Small B pleural effusion.  PV congestion  _______________________________________________________________  Assessment and Plan: POD 2 s/p CABG.  Hx of asystolic events.    Neuro: pain controlled CV: continue pacing.  EP on board, will likely need permanent pacemaker Pulm: continue pulm toilet Renal: creat down trend, close to baseline.  Will remove foley.  Gentle diuresis GI: on diet Heme: stable.  plts stable ID: afebrile, nml WBC Endo: SSI Dispo: continue ICU care given rhythm issues  Melodie Bouillon, MD 08/06/2019 9:08 AM

## 2019-08-07 LAB — CBC
HCT: 31.7 % — ABNORMAL LOW (ref 39.0–52.0)
Hemoglobin: 10.7 g/dL — ABNORMAL LOW (ref 13.0–17.0)
MCH: 30.4 pg (ref 26.0–34.0)
MCHC: 33.8 g/dL (ref 30.0–36.0)
MCV: 90.1 fL (ref 80.0–100.0)
Platelets: 148 10*3/uL — ABNORMAL LOW (ref 150–400)
RBC: 3.52 MIL/uL — ABNORMAL LOW (ref 4.22–5.81)
RDW: 14.5 % (ref 11.5–15.5)
WBC: 11.8 10*3/uL — ABNORMAL HIGH (ref 4.0–10.5)
nRBC: 0 % (ref 0.0–0.2)

## 2019-08-07 LAB — TYPE AND SCREEN
ABO/RH(D): A POS
Antibody Screen: NEGATIVE
Unit division: 0
Unit division: 0
Unit division: 0
Unit division: 0

## 2019-08-07 LAB — BASIC METABOLIC PANEL
Anion gap: 10 (ref 5–15)
BUN: 16 mg/dL (ref 8–23)
CO2: 24 mmol/L (ref 22–32)
Calcium: 8.1 mg/dL — ABNORMAL LOW (ref 8.9–10.3)
Chloride: 104 mmol/L (ref 98–111)
Creatinine, Ser: 1.57 mg/dL — ABNORMAL HIGH (ref 0.61–1.24)
GFR calc Af Amer: 50 mL/min — ABNORMAL LOW (ref >60)
GFR calc non Af Amer: 43 mL/min — ABNORMAL LOW (ref >60)
Glucose, Bld: 146 mg/dL — ABNORMAL HIGH (ref 70–99)
Potassium: 3.4 mmol/L — ABNORMAL LOW (ref 3.5–5.1)
Sodium: 138 mmol/L (ref 135–145)

## 2019-08-07 LAB — BPAM RBC
Blood Product Expiration Date: 202011222359
Blood Product Expiration Date: 202011232359
Blood Product Expiration Date: 202011282359
Blood Product Expiration Date: 202011282359
ISSUE DATE / TIME: 202010290831
ISSUE DATE / TIME: 202010290831
ISSUE DATE / TIME: 202010290831
ISSUE DATE / TIME: 202010290831
Unit Type and Rh: 6200
Unit Type and Rh: 6200
Unit Type and Rh: 6200
Unit Type and Rh: 6200

## 2019-08-07 LAB — GLUCOSE, CAPILLARY
Glucose-Capillary: 111 mg/dL — ABNORMAL HIGH (ref 70–99)
Glucose-Capillary: 137 mg/dL — ABNORMAL HIGH (ref 70–99)
Glucose-Capillary: 139 mg/dL — ABNORMAL HIGH (ref 70–99)
Glucose-Capillary: 154 mg/dL — ABNORMAL HIGH (ref 70–99)
Glucose-Capillary: 156 mg/dL — ABNORMAL HIGH (ref 70–99)
Glucose-Capillary: 171 mg/dL — ABNORMAL HIGH (ref 70–99)

## 2019-08-07 MED ORDER — FUROSEMIDE 40 MG PO TABS
40.0000 mg | ORAL_TABLET | Freq: Every day | ORAL | Status: DC
  Administered 2019-08-07 – 2019-08-09 (×3): 40 mg via ORAL
  Filled 2019-08-07 (×3): qty 1

## 2019-08-07 MED ORDER — POTASSIUM CHLORIDE CRYS ER 20 MEQ PO TBCR
40.0000 meq | EXTENDED_RELEASE_TABLET | Freq: Every day | ORAL | Status: DC
  Administered 2019-08-07 – 2019-08-08 (×2): 40 meq via ORAL
  Filled 2019-08-07 (×2): qty 2

## 2019-08-07 MED ORDER — POTASSIUM CHLORIDE CRYS ER 20 MEQ PO TBCR
20.0000 meq | EXTENDED_RELEASE_TABLET | ORAL | Status: AC
  Administered 2019-08-07: 06:00:00 20 meq via ORAL
  Filled 2019-08-07 (×2): qty 1

## 2019-08-07 NOTE — Progress Notes (Signed)
     [name]       [name] [address]             [phone]       No events Stable rhythm today Ambulated this evening  Paul Torres Stina Gane

## 2019-08-07 NOTE — Progress Notes (Signed)
      [address]       [place] [address]             [phone]                 3 Days Post-Op Procedure(s) (LRB): CORONARY ARTERY BYPASS GRAFTING (CABG) x 5 WITH ENDOSCOPIC HARVESTING OF RIGHT GREATER SAPHENOUS VEIN. LIMA TO LAD (N/A) TRANSESOPHAGEAL ECHOCARDIOGRAM (TEE) (N/A)   Events: No events Pacing cut to rate of 50 this am by EP Has already ambulated once this am ____________________________________________ Vitals: BP 134/66 (BP Location: Right Arm)   Pulse 89   Temp 97.8 F (36.6 C) (Oral)   Resp 12   Ht 5\' 10"  (1.778 m)   Wt 82.7 kg   SpO2 98%   BMI 26.16 kg/m   - Neuro: alert NAD  - Cardiovascular: sinus in 70s  Drips: amio.     - Pulm: EWOB on RA    ABG    Component Value Date/Time   PHART 7.323 (L) 08/04/2019 1958   PCO2ART 42.7 08/04/2019 1958   PO2ART 136.0 (H) 08/04/2019 1958   HCO3 21.9 08/04/2019 1958   TCO2 23 08/04/2019 1958   ACIDBASEDEF 4.0 (H) 08/04/2019 1958   O2SAT 99.0 08/04/2019 1958    - Abd: soft ND, NT - Extremity: trace  .Intake/Output      10/31 0701 - 11/01 0700 11/01 0701 - 11/02 0700   P.O. 600 60   I.V. (mL/kg) 1250.9 (15.1) 46.7 (0.6)   Other 0    IV Piggyback 173.7    Total Intake(mL/kg) 2024.6 (24.5) 106.7 (1.3)   Urine (mL/kg/hr) 3655 (1.8) 0 (0)   Emesis/NG output 0 0   Stool 0 0   Chest Tube     Total Output 3655 0   Net -1630.4 +106.7        Urine Occurrence 0 x 0 x   Stool Occurrence 0 x 0 x   Emesis Occurrence 1 x 0 x      _______________________________________________________________ Labs: CBC Latest Ref Rng & Units 08/07/2019 08/06/2019 08/05/2019  WBC 4.0 - 10.5 K/uL 11.8(H) 9.9 11.7(H)  Hemoglobin 13.0 - 17.0 g/dL 10.7(L) 9.5(L) 9.9(L)  Hematocrit 39.0 - 52.0 % 31.7(L) 28.7(L) 29.8(L)  Platelets 150 - 400 K/uL 148(L) 97(L) 107(L)   CMP Latest Ref Rng & Units 08/07/2019 08/06/2019 08/05/2019  Glucose 70 - 99 mg/dL 146(H) 151(H) 210(H)  BUN 8 - 23 mg/dL 16 17 15    Creatinine 0.61 - 1.24 mg/dL 1.57(H) 1.63(H) 1.70(H)  Sodium 135 - 145 mmol/L 138 136 138  Potassium 3.5 - 5.1 mmol/L 3.4(L) 3.9 4.0  Chloride 98 - 111 mmol/L 104 104 107  CO2 22 - 32 mmol/L 24 23 21(L)  Calcium 8.9 - 10.3 mg/dL 8.1(L) 7.9(L) 7.8(L)    CXR: none  _______________________________________________________________  Assessment and Plan: POD 3 s/p CABG.  Hx of asystolic events.    Neuro: pain controlled CV: EP set pacer to back up rate for further evaluation.  Given long hx, strong likelihood that patient will require PPM Pulm: continue pulm toilet Renal: creat down trend, close to baseline.  Continue gentle diuresis GI: on diet Heme: stable.  plts stable ID: afebrile, nml WBC Endo: SSI Dispo: continue ICU care given rhythm issues  Melodie Bouillon, MD 08/07/2019 8:44 AM

## 2019-08-07 NOTE — Progress Notes (Signed)
Progress Note  Patient Name: Paul Torres Date of Encounter: 08/07/2019  Primary Cardiologist: No primary care provider on file.   Subjective   No chest pain or sob  Inpatient Medications    Scheduled Meds: . acetaminophen  1,000 mg Oral Q6H   Or  . acetaminophen (TYLENOL) oral liquid 160 mg/5 mL  1,000 mg Per Tube Q6H  . aspirin EC  325 mg Oral Daily   Or  . aspirin  324 mg Per Tube Daily  . atorvastatin  80 mg Oral q1800  . bisacodyl  10 mg Oral Daily   Or  . bisacodyl  10 mg Rectal Daily  . Chlorhexidine Gluconate Cloth  6 each Topical Daily  . docusate sodium  200 mg Oral Daily  . dorzolamide-timolol  1 drop Both Eyes BID  . enoxaparin (LOVENOX) injection  40 mg Subcutaneous QHS  . insulin aspart  0-24 Units Subcutaneous Q4H  . ketotifen  1 drop Both Eyes BID  . mouth rinse  15 mL Mouth Rinse BID  . pantoprazole  40 mg Oral Daily  . potassium chloride  20 mEq Oral Q4H  . sodium chloride flush  10-40 mL Intracatheter Q12H  . sodium chloride flush  3 mL Intravenous Q12H   Continuous Infusions: . sodium chloride Stopped (08/05/19 0951)  . sodium chloride    . sodium chloride 10 mL/hr at 08/07/19 0800  . amiodarone 30 mg/hr (08/07/19 0500)  . lactated ringers 20 mL/hr at 08/07/19 0500  . lactated ringers    . nitroGLYCERIN Stopped (08/04/19 2302)   PRN Meds: sodium chloride, morphine injection, ondansetron (ZOFRAN) IV, oxyCODONE, sodium chloride flush, sodium chloride flush, traMADol   Vital Signs    Vitals:   08/07/19 0600 08/07/19 0700 08/07/19 0728 08/07/19 0800  BP: (!) 137/92 126/71 126/71 134/66  Pulse: 90 89 88 89  Resp: 18 11 12 12   Temp:   97.7 F (36.5 C) 97.8 F (36.6 C)  TempSrc:   Oral Oral  SpO2: 100% 97% 98% 98%  Weight:      Height:        Intake/Output Summary (Last 24 hours) at 08/07/2019 0843 Last data filed at 08/07/2019 0800 Gross per 24 hour  Intake 1931.38 ml  Output 3475 ml  Net -1543.62 ml   Filed Weights   08/05/19  0400 08/06/19 0619 08/07/19 0500  Weight: 83.8 kg 84 kg 82.7 kg    Telemetry    nsr with PAC's - Personally Reviewed  ECG    none - Personally Reviewed  Physical Exam   GEN: No acute distress.   Neck: No JVD Cardiac: RRR, no murmurs, rubs, or gallops.  Respiratory: Clear to auscultation bilaterally except for minimal basilar rales GI: Soft, nontender, non-distended  MS: No edema; No deformity. Neuro:  Nonfocal  Psych: Normal affect   Labs    Chemistry Recent Labs  Lab 08/05/19 1513 08/06/19 0346 08/07/19 0359  NA 138 136 138  K 4.0 3.9 3.4*  CL 107 104 104  CO2 21* 23 24  GLUCOSE 210* 151* 146*  BUN 15 17 16   CREATININE 1.70* 1.63* 1.57*  CALCIUM 7.8* 7.9* 8.1*  GFRNONAA 39* 41* 43*  GFRAA 45* 48* 50*  ANIONGAP 10 9 10      Hematology Recent Labs  Lab 08/05/19 1513 08/06/19 0346 08/07/19 0359  WBC 11.7* 9.9 11.8*  RBC 3.28* 3.13* 3.52*  HGB 9.9* 9.5* 10.7*  HCT 29.8* 28.7* 31.7*  MCV 90.9 91.7 90.1  MCH 30.2  30.4 30.4  MCHC 33.2 33.1 33.8  RDW 14.5 14.6 14.5  PLT 107* 97* 148*    Cardiac EnzymesNo results for input(s): TROPONINI in the last 168 hours. No results for input(s): TROPIPOC in the last 168 hours.   BNPNo results for input(s): BNP, PROBNP in the last 168 hours.   DDimer No results for input(s): DDIMER in the last 168 hours.   Radiology    Dg Chest Port 1 View  Result Date: 08/06/2019 CLINICAL DATA:  Status post CABG on 08/04/2019. EXAM: PORTABLE CHEST 1 VIEW COMPARISON:  08/05/2019 FINDINGS: The right jugular Swan-Ganz catheter has been removed and the sheath remains in place. The mediastinal and left chest tubes have been removed. No pneumothorax. Stable enlarged cardiac silhouette and post CABG changes. Interval minimal patchy opacity at the right lung base and mildly increased patchy opacity at the left lung base. Thoracic spine degenerative changes. IMPRESSION: 1. Interval minimal patchy atelectasis or pneumonia at the right lung  base and mildly increased patchy atelectasis or pneumonia at the left lung base. 2. Stable cardiomegaly. Electronically Signed   By: Claudie Revering M.D.   On: 08/06/2019 07:50    Cardiac Studies   none  Patient Profile     73 y.o. 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 Post op atrial fib - he is back in NSR with PAC's today on amio 2. Sinus node dysfunction - I have turned his PPM down to 50/min. If he has much in the way of pacing today, I would be inclined to proceed with PPM.  3. CAD - he is stable s/p CABG     For questions or updates, please contact [place] Please consult [email or web address] for contact info under Cardiology/STEMI.      Signed, Cristopher Peru, MD  08/07/2019, 8:43 AM  Patient ID: Paul Torres, male   DOB: [birth year], 73 y.o.   MRN: [id number]

## 2019-08-07 NOTE — Plan of Care (Signed)
  Problem: Education: Goal: Knowledge of General Education information will improve Description: Including pain rating scale, medication(s)/side effects and non-pharmacologic comfort measures Outcome: Progressing  Pt is alert and oriented most times, and when disoriented, can be easily redirected and educated. Pt verbalizes understanding of medications. Problem: Clinical Measurements: Goal: Respiratory complications will improve Outcome: Progressing Goal: Cardiovascular complication will be avoided Outcome: Progressing  Pt continues in underlying AFIB, heart rate has maintained in the 90s throughout the night, ventricular pacing.  Problem: Activity: Goal: Risk for activity intolerance will decrease Outcome: Progressing  Pt was able to walk in the hallway with standby assist, tolerates activity very well. Problem: Elimination: Goal: Will not experience complications related to urinary retention Outcome: Progressing  Pt was able to use the urinal throughout the night. Problem: Pain Managment: Goal: General experience of comfort will improve Outcome: Progressing  Pt has not required any PRN medication throughout the night, continues to deny pain. Problem: Activity: Goal: Risk for activity intolerance will decrease Outcome: Progressing   Problem: Cardiac: Goal: Will achieve and/or maintain hemodynamic stability Outcome: Progressing   Problem: Respiratory: Goal: Respiratory status will improve Outcome: Progressing  Pt was weaned from 2L [place] to room air while ambulating, recovered very well once in bed, oxygen saturation remains at 100%.

## 2019-08-08 ENCOUNTER — Encounter (HOSPITAL_COMMUNITY): Payer: Self-pay | Admitting: *Deleted

## 2019-08-08 LAB — POCT I-STAT 7, (LYTES, BLD GAS, ICA,H+H)
Acid-base deficit: 4 mmol/L — ABNORMAL HIGH (ref 0.0–2.0)
Acid-base deficit: 4 mmol/L — ABNORMAL HIGH (ref 0.0–2.0)
Acid-base deficit: 2 mmol/L (ref 0.0–2.0)
Bicarbonate: 24.9 mmol/L (ref 20.0–28.0)
Bicarbonate: 21.2 mmol/L (ref 20.0–28.0)
Bicarbonate: 20.6 mmol/L (ref 20.0–28.0)
Bicarbonate: 21.6 mmol/L (ref 20.0–28.0)
Calcium, Ion: 1.2 mmol/L (ref 1.15–1.40)
Calcium, Ion: 1.15 mmol/L (ref 1.15–1.40)
Calcium, Ion: 0.91 mmol/L — ABNORMAL LOW (ref 1.15–1.40)
Calcium, Ion: 1.07 mmol/L — ABNORMAL LOW (ref 1.15–1.40)
HCT: 25 % — ABNORMAL LOW (ref 39.0–52.0)
HCT: 24 % — ABNORMAL LOW (ref 39.0–52.0)
HCT: 27 % — ABNORMAL LOW (ref 39.0–52.0)
HCT: 27 % — ABNORMAL LOW (ref 39.0–52.0)
Hemoglobin: 8.5 g/dL — ABNORMAL LOW (ref 13.0–17.0)
Hemoglobin: 8.2 g/dL — ABNORMAL LOW (ref 13.0–17.0)
Hemoglobin: 9.2 g/dL — ABNORMAL LOW (ref 13.0–17.0)
Hemoglobin: 9.2 g/dL — ABNORMAL LOW (ref 13.0–17.0)
O2 Saturation: 100 %
O2 Saturation: 100 %
O2 Saturation: 100 %
O2 Saturation: 99 %
Potassium: 3.5 mmol/L (ref 3.5–5.1)
Potassium: 3.1 mmol/L — ABNORMAL LOW (ref 3.5–5.1)
Potassium: 4.6 mmol/L (ref 3.5–5.1)
Potassium: 4.5 mmol/L (ref 3.5–5.1)
Sodium: 141 mmol/L (ref 135–145)
Sodium: 141 mmol/L (ref 135–145)
Sodium: 142 mmol/L (ref 135–145)
Sodium: 142 mmol/L (ref 135–145)
TCO2: 26 mmol/L (ref 22–32)
TCO2: 22 mmol/L (ref 22–32)
TCO2: 22 mmol/L (ref 22–32)
TCO2: 23 mmol/L (ref 22–32)
pCO2 arterial: 39.1 mmHg (ref 32.0–48.0)
pCO2 arterial: 35.5 mmHg (ref 32.0–48.0)
pCO2 arterial: 33.1 mmHg (ref 32.0–48.0)
pCO2 arterial: 33.2 mmHg (ref 32.0–48.0)
pH, Arterial: 7.412 (ref 7.350–7.450)
pH, Arterial: 7.383 (ref 7.350–7.450)
pH, Arterial: 7.403 (ref 7.350–7.450)
pH, Arterial: 7.421 (ref 7.350–7.450)
pO2, Arterial: 402 mmHg — ABNORMAL HIGH (ref 83.0–108.0)
pO2, Arterial: 191 mmHg — ABNORMAL HIGH (ref 83.0–108.0)
pO2, Arterial: 318 mmHg — ABNORMAL HIGH (ref 83.0–108.0)
pO2, Arterial: 121 mmHg — ABNORMAL HIGH (ref 83.0–108.0)

## 2019-08-08 LAB — POCT I-STAT, CHEM 8
BUN: 11 mg/dL (ref 8–23)
BUN: 10 mg/dL (ref 8–23)
BUN: 10 mg/dL (ref 8–23)
BUN: 10 mg/dL (ref 8–23)
Calcium, Ion: 1.21 mmol/L (ref 1.15–1.40)
Calcium, Ion: 1.18 mmol/L (ref 1.15–1.40)
Calcium, Ion: 1.08 mmol/L — ABNORMAL LOW (ref 1.15–1.40)
Calcium, Ion: 1.04 mmol/L — ABNORMAL LOW (ref 1.15–1.40)
Chloride: 104 mmol/L (ref 98–111)
Chloride: 104 mmol/L (ref 98–111)
Chloride: 102 mmol/L (ref 98–111)
Chloride: 105 mmol/L (ref 98–111)
Creatinine, Ser: 1.3 mg/dL — ABNORMAL HIGH (ref 0.61–1.24)
Creatinine, Ser: 1.2 mg/dL (ref 0.61–1.24)
Creatinine, Ser: 1.1 mg/dL (ref 0.61–1.24)
Creatinine, Ser: 1.1 mg/dL (ref 0.61–1.24)
Glucose, Bld: 115 mg/dL — ABNORMAL HIGH (ref 70–99)
Glucose, Bld: 116 mg/dL — ABNORMAL HIGH (ref 70–99)
Glucose, Bld: 133 mg/dL — ABNORMAL HIGH (ref 70–99)
Glucose, Bld: 131 mg/dL — ABNORMAL HIGH (ref 70–99)
HCT: 23 % — ABNORMAL LOW (ref 39.0–52.0)
HCT: 22 % — ABNORMAL LOW (ref 39.0–52.0)
HCT: 28 % — ABNORMAL LOW (ref 39.0–52.0)
HCT: 26 % — ABNORMAL LOW (ref 39.0–52.0)
Hemoglobin: 7.8 g/dL — ABNORMAL LOW (ref 13.0–17.0)
Hemoglobin: 7.5 g/dL — ABNORMAL LOW (ref 13.0–17.0)
Hemoglobin: 9.5 g/dL — ABNORMAL LOW (ref 13.0–17.0)
Hemoglobin: 8.8 g/dL — ABNORMAL LOW (ref 13.0–17.0)
Potassium: 3.5 mmol/L (ref 3.5–5.1)
Potassium: 3.1 mmol/L — ABNORMAL LOW (ref 3.5–5.1)
Potassium: 3.7 mmol/L (ref 3.5–5.1)
Potassium: 5.2 mmol/L — ABNORMAL HIGH (ref 3.5–5.1)
Sodium: 141 mmol/L (ref 135–145)
Sodium: 141 mmol/L (ref 135–145)
Sodium: 142 mmol/L (ref 135–145)
Sodium: 141 mmol/L (ref 135–145)
TCO2: 25 mmol/L (ref 22–32)
TCO2: 20 mmol/L — ABNORMAL LOW (ref 22–32)
TCO2: 27 mmol/L (ref 22–32)
TCO2: 24 mmol/L (ref 22–32)

## 2019-08-08 LAB — CBC
HCT: 31.1 % — ABNORMAL LOW (ref 39.0–52.0)
Hemoglobin: 10.8 g/dL — ABNORMAL LOW (ref 13.0–17.0)
MCH: 30.9 pg (ref 26.0–34.0)
MCHC: 34.7 g/dL (ref 30.0–36.0)
MCV: 88.9 fL (ref 80.0–100.0)
Platelets: 197 10*3/uL (ref 150–400)
RBC: 3.5 MIL/uL — ABNORMAL LOW (ref 4.22–5.81)
RDW: 14.5 % (ref 11.5–15.5)
WBC: 9.4 10*3/uL (ref 4.0–10.5)
nRBC: 0 % (ref 0.0–0.2)

## 2019-08-08 LAB — BASIC METABOLIC PANEL
Anion gap: 8 (ref 5–15)
BUN: 16 mg/dL (ref 8–23)
CO2: 27 mmol/L (ref 22–32)
Calcium: 8.3 mg/dL — ABNORMAL LOW (ref 8.9–10.3)
Chloride: 103 mmol/L (ref 98–111)
Creatinine, Ser: 1.52 mg/dL — ABNORMAL HIGH (ref 0.61–1.24)
GFR calc Af Amer: 52 mL/min — ABNORMAL LOW (ref >60)
GFR calc non Af Amer: 45 mL/min — ABNORMAL LOW (ref >60)
Glucose, Bld: 132 mg/dL — ABNORMAL HIGH (ref 70–99)
Potassium: 3.8 mmol/L (ref 3.5–5.1)
Sodium: 138 mmol/L (ref 135–145)

## 2019-08-08 LAB — GLUCOSE, CAPILLARY
Glucose-Capillary: 125 mg/dL — ABNORMAL HIGH (ref 70–99)
Glucose-Capillary: 121 mg/dL — ABNORMAL HIGH (ref 70–99)
Glucose-Capillary: 97 mg/dL (ref 70–99)
Glucose-Capillary: 132 mg/dL — ABNORMAL HIGH (ref 70–99)
Glucose-Capillary: 112 mg/dL — ABNORMAL HIGH (ref 70–99)

## 2019-08-08 MED ORDER — SODIUM CHLORIDE 0.9% FLUSH
3.0000 mL | INTRAVENOUS | Status: DC | PRN
  Administered 2019-08-08: 11:00:00 3 mL via INTRAVENOUS
  Filled 2019-08-08: qty 3

## 2019-08-08 MED ORDER — TAMSULOSIN HCL 0.4 MG PO CAPS
0.4000 mg | ORAL_CAPSULE | Freq: Every day | ORAL | Status: DC
  Administered 2019-08-08 – 2019-08-11 (×4): 0.4 mg via ORAL
  Filled 2019-08-08 (×4): qty 1

## 2019-08-08 MED ORDER — VITAMIN B-12 1000 MCG PO TABS
500.0000 ug | ORAL_TABLET | Freq: Every day | ORAL | Status: DC
  Administered 2019-08-08 – 2019-08-12 (×5): 500 ug via ORAL
  Filled 2019-08-08 (×5): qty 1

## 2019-08-08 MED ORDER — ONDANSETRON HCL 4 MG PO TABS: 4.0000 mg | ORAL_TABLET | Freq: Four times a day (QID) | ORAL | Status: DC | PRN

## 2019-08-08 MED ORDER — SODIUM CHLORIDE 0.9 % IV SOLN: 250.0000 mL | INTRAVENOUS | Status: DC | PRN

## 2019-08-08 MED ORDER — PANTOPRAZOLE SODIUM 40 MG PO TBEC
40.0000 mg | DELAYED_RELEASE_TABLET | Freq: Every day | ORAL | Status: DC
  Administered 2019-08-08 – 2019-08-12 (×5): 40 mg via ORAL
  Filled 2019-08-08 (×5): qty 1

## 2019-08-08 MED ORDER — CHLORHEXIDINE GLUCONATE CLOTH 2 % EX PADS
6.0000 | MEDICATED_PAD | Freq: Every day | CUTANEOUS | Status: DC
  Administered 2019-08-08 – 2019-08-12 (×2): 6 via TOPICAL

## 2019-08-08 MED ORDER — INSULIN ASPART 100 UNIT/ML ~~LOC~~ SOLN
0.0000 [IU] | Freq: Three times a day (TID) | SUBCUTANEOUS | Status: DC
  Administered 2019-08-08: 2 [IU] via SUBCUTANEOUS

## 2019-08-08 MED ORDER — OXYCODONE HCL 5 MG PO TABS
5.0000 mg | ORAL_TABLET | ORAL | Status: DC | PRN
  Administered 2019-08-09 – 2019-08-12 (×4): 10 mg via ORAL
  Filled 2019-08-08 (×5): qty 2

## 2019-08-08 MED ORDER — BISACODYL 5 MG PO TBEC
10.0000 mg | DELAYED_RELEASE_TABLET | Freq: Every day | ORAL | Status: DC | PRN
  Administered 2019-08-09: 10 mg via ORAL
  Filled 2019-08-08: qty 2

## 2019-08-08 MED ORDER — ~~LOC~~ CARDIAC SURGERY, PATIENT & FAMILY EDUCATION: Freq: Once | Status: AC

## 2019-08-08 MED ORDER — ONDANSETRON HCL 4 MG/2ML IJ SOLN
4.0000 mg | Freq: Four times a day (QID) | INTRAMUSCULAR | Status: DC | PRN
  Administered 2019-08-08: 4 mg via INTRAVENOUS
  Filled 2019-08-08: qty 2

## 2019-08-08 MED ORDER — DOCUSATE SODIUM 100 MG PO CAPS
200.0000 mg | ORAL_CAPSULE | Freq: Every day | ORAL | Status: DC
  Administered 2019-08-08 – 2019-08-12 (×5): 200 mg via ORAL
  Filled 2019-08-08 (×5): qty 2

## 2019-08-08 MED ORDER — METOLAZONE 5 MG PO TABS
2.5000 mg | ORAL_TABLET | Freq: Every day | ORAL | Status: DC
  Administered 2019-08-08 – 2019-08-09 (×2): 2.5 mg via ORAL
  Filled 2019-08-08 (×2): qty 1

## 2019-08-08 MED ORDER — BISACODYL 10 MG RE SUPP: 10.0000 mg | Freq: Every day | RECTAL | Status: DC | PRN

## 2019-08-08 MED ORDER — TRAMADOL HCL 50 MG PO TABS: 50.0000 mg | ORAL_TABLET | ORAL | Status: DC | PRN

## 2019-08-08 MED ORDER — INSULIN ASPART 100 UNIT/ML ~~LOC~~ SOLN
0.0000 [IU] | Freq: Three times a day (TID) | SUBCUTANEOUS | Status: DC
  Administered 2019-08-08: 15:00:00 2 [IU] via SUBCUTANEOUS
  Administered 2019-08-09 – 2019-08-10 (×3): 4 [IU] via SUBCUTANEOUS
  Administered 2019-08-10 – 2019-08-11 (×3): 2 [IU] via SUBCUTANEOUS

## 2019-08-08 MED ORDER — TRAZODONE HCL 50 MG PO TABS
50.0000 mg | ORAL_TABLET | Freq: Every day | ORAL | Status: DC
  Administered 2019-08-08 – 2019-08-11 (×4): 50 mg via ORAL
  Filled 2019-08-08 (×4): qty 1

## 2019-08-08 MED ORDER — ACETAMINOPHEN 325 MG PO TABS
650.0000 mg | ORAL_TABLET | Freq: Four times a day (QID) | ORAL | Status: DC | PRN
  Administered 2019-08-08 – 2019-08-09 (×2): 650 mg via ORAL
  Filled 2019-08-08 (×2): qty 2

## 2019-08-08 MED ORDER — INSULIN DETEMIR 100 UNIT/ML ~~LOC~~ SOLN
20.0000 [IU] | Freq: Every day | SUBCUTANEOUS | Status: DC
  Administered 2019-08-08 – 2019-08-12 (×5): 20 [IU] via SUBCUTANEOUS
  Filled 2019-08-08 (×5): qty 0.2

## 2019-08-08 MED ORDER — SODIUM CHLORIDE 0.9% FLUSH
3.0000 mL | Freq: Two times a day (BID) | INTRAVENOUS | Status: DC
  Administered 2019-08-08 – 2019-08-12 (×9): 3 mL via INTRAVENOUS

## 2019-08-08 MED ORDER — ASPIRIN EC 325 MG PO TBEC
325.0000 mg | DELAYED_RELEASE_TABLET | Freq: Every day | ORAL | Status: DC
  Administered 2019-08-08 – 2019-08-12 (×5): 325 mg via ORAL
  Filled 2019-08-08 (×5): qty 1

## 2019-08-08 MED ORDER — AMIODARONE HCL 200 MG PO TABS
200.0000 mg | ORAL_TABLET | Freq: Two times a day (BID) | ORAL | Status: DC
  Administered 2019-08-08 – 2019-08-12 (×9): 200 mg via ORAL
  Filled 2019-08-08 (×9): qty 1

## 2019-08-08 NOTE — Progress Notes (Signed)
Patient into a fib 90s-100s after walking. Bartle MD notified.

## 2019-08-08 NOTE — Progress Notes (Signed)
Pt received from [place]. Pt c/a/ox4. Vitals stable. ccmd notified, pt placed on telebox 21. Pt denies complaints. Pt oriented to room and callbell within reach. Will continue to monitor. Jerald Kief

## 2019-08-08 NOTE — Progress Notes (Signed)
4 Days Post-Op Procedure(s) (LRB): CORONARY ARTERY BYPASS GRAFTING (CABG) x 5 WITH ENDOSCOPIC HARVESTING OF RIGHT GREATER SAPHENOUS VEIN. LIMA TO LAD (N/A) TRANSESOPHAGEAL ECHOCARDIOGRAM (TEE) (N/A) Subjective: No chest pain or shortness of breath. Slept better. Not much appetite.  Objective: Vital signs in last 24 hours: Temp:  [96.8 F (36 C)-98.9 F (37.2 C)] 97.7 F (36.5 C) (11/02 0700) Pulse Rate:  [63-89] 63 (11/02 0700) Cardiac Rhythm: Ventricular paced (11/02 0400) Resp:  [8-17] 13 (11/02 0700) BP: (122-157)/(53-73) 151/62 (11/02 0700) SpO2:  [95 %-100 %] 96 % (11/02 0700) Weight:  [84.3 kg] 84.3 kg (11/02 0500)  Hemodynamic parameters for last 24 hours:    Intake/Output from previous day: 11/01 0701 - 11/02 0700 In: 1793.8 [P.O.:660; I.V.:1133.8] Out: 2850 [Urine:2850] Intake/Output this shift: No intake/output data recorded.  General appearance: alert and cooperative Neurologic: intact Heart: regular rate and rhythm, S1, S2 normal, no murmur, click, rub or gallop Lungs: clear to auscultation bilaterally Extremities: extremities normal, atraumatic, no cyanosis or edema Wound: incision ok  Lab Results: Recent Labs    08/07/19 0359 08/08/19 0400  WBC 11.8* 9.4  HGB 10.7* 10.8*  HCT 31.7* 31.1*  PLT 148* 197   BMET:  Recent Labs    08/07/19 0359 08/08/19 0400  NA 138 138  K 3.4* 3.8  CL 104 103  CO2 24 27  GLUCOSE 146* 132*  BUN 16 16  CREATININE 1.57* 1.52*  CALCIUM 8.1* 8.3*    PT/INR: No results for input(s): LABPROT, INR in the last 72 hours. ABG    Component Value Date/Time   PHART 7.323 (L) 08/04/2019 1958   HCO3 21.9 08/04/2019 1958   TCO2 23 08/04/2019 1958   ACIDBASEDEF 4.0 (H) 08/04/2019 1958   O2SAT 99.0 08/04/2019 1958   CBG (last 3)  Recent Labs    08/07/19 1959 08/07/19 2330 08/08/19 0359  GLUCAP 171* 137* 125*    Assessment/Plan: S/P Procedure(s) (LRB): CORONARY ARTERY BYPASS GRAFTING (CABG) x 5 WITH ENDOSCOPIC  HARVESTING OF RIGHT GREATER SAPHENOUS VEIN. LIMA TO LAD (N/A) TRANSESOPHAGEAL ECHOCARDIOGRAM (TEE) (N/A)  POD 4  Hemodynamically stable in sinus rhythm on amio for postop atrial fib. Pacer on backup VVI 50 with a few infrequent paced beats. Will switch amio to po. EP following. No beta blocker for now.  DM: glucose under adequate control but still 171 last pm. Will add Levemir to SSI. Hold off on oral agents until eating better.  Volume excess: wt is 23 lbs over recorded preop wt if preop is accurate. He has no significant edema. Will diurese further.   Stage 3 CKD: creat stable. Follow. Hold off on ACE I that he was on preop.  DC neck sleeve.  Transfer to 4E and continue mobilization.   LOS: 5 days    Paul Torres 08/08/2019

## 2019-08-08 NOTE — Addendum Note (Signed)
Addendum  created 08/08/19 0801 by Sammie Bench, CRNA   Order list changed

## 2019-08-08 NOTE — Addendum Note (Signed)
Addendum  created 08/08/19 0752 by Sammie Bench, CRNA   Order list changed
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
## 2019-08-09 LAB — BASIC METABOLIC PANEL
Anion gap: 12 (ref 5–15)
BUN: 18 mg/dL (ref 8–23)
CO2: 29 mmol/L (ref 22–32)
Calcium: 8.6 mg/dL — ABNORMAL LOW (ref 8.9–10.3)
Chloride: 97 mmol/L — ABNORMAL LOW (ref 98–111)
Creatinine, Ser: 1.8 mg/dL — ABNORMAL HIGH (ref 0.61–1.24)
GFR calc Af Amer: 42 mL/min — ABNORMAL LOW (ref >60)
GFR calc non Af Amer: 37 mL/min — ABNORMAL LOW (ref >60)
Glucose, Bld: 79 mg/dL (ref 70–99)
Potassium: 3.5 mmol/L (ref 3.5–5.1)
Sodium: 138 mmol/L (ref 135–145)

## 2019-08-09 LAB — GLUCOSE, CAPILLARY
Glucose-Capillary: 73 mg/dL (ref 70–99)
Glucose-Capillary: 185 mg/dL — ABNORMAL HIGH (ref 70–99)
Glucose-Capillary: 191 mg/dL — ABNORMAL HIGH (ref 70–99)
Glucose-Capillary: 85 mg/dL (ref 70–99)

## 2019-08-09 MED ORDER — POTASSIUM CHLORIDE CRYS ER 20 MEQ PO TBCR
40.0000 meq | EXTENDED_RELEASE_TABLET | Freq: Two times a day (BID) | ORAL | Status: AC
  Administered 2019-08-09 (×2): 40 meq via ORAL
  Filled 2019-08-09 (×2): qty 2

## 2019-08-09 NOTE — Care Management Important Message (Signed)
Important Message  Patient Details  Name: Paul Torres MRN: [id number] Date of Birth: 28-Oct-1945   Medicare Important Message Given:  Yes     Shelda Altes 08/09/2019, 3:14 PM
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
## 2019-08-09 NOTE — Progress Notes (Signed)
CARDIAC REHAB PHASE I   PRE:  Rate/Rhythm: 82 SR  BP:  Sitting: 136/62      SaO2: 94 RA  MODE:  Ambulation: 470 ft   POST:  Rate/Rhythm: 110 ST  BP:  Sitting: 130/73    SaO2: 97 RA  Pt ambulated 48ft in hallway standby assist with front wheel walker. Pt denies CP or SOB. Pt needing reminders of sternal precautions. Encouraged continued IS use and walks.   [id number] Rufina Falco, RN BSN 08/09/2019 11:31 AM

## 2019-08-09 NOTE — Progress Notes (Signed)
  Conduction has improved with re-vascularization.  No current indication for pacemaker.   Could plan to see Afib clinic in 2 weeks post hospital if atrial fibrillation does not improve. He has had intermittent afib but is in NSR this am on exam. Continue amiodarone.  Electrophysiology team to see as needed while here. Please call with questions.   [id number] [place]  Pager: [phone]  08/09/2019 8:13 AM

## 2019-08-10 LAB — BASIC METABOLIC PANEL
Anion gap: 9 (ref 5–15)
BUN: 21 mg/dL (ref 8–23)
CO2: 33 mmol/L — ABNORMAL HIGH (ref 22–32)
Calcium: 8.8 mg/dL — ABNORMAL LOW (ref 8.9–10.3)
Chloride: 99 mmol/L (ref 98–111)
Creatinine, Ser: 2 mg/dL — ABNORMAL HIGH (ref 0.61–1.24)
GFR calc Af Amer: 37 mL/min — ABNORMAL LOW (ref >60)
GFR calc non Af Amer: 32 mL/min — ABNORMAL LOW (ref >60)
Glucose, Bld: 116 mg/dL — ABNORMAL HIGH (ref 70–99)
Potassium: 4.4 mmol/L (ref 3.5–5.1)
Sodium: 141 mmol/L (ref 135–145)

## 2019-08-10 LAB — GLUCOSE, CAPILLARY
Glucose-Capillary: 104 mg/dL — ABNORMAL HIGH (ref 70–99)
Glucose-Capillary: 169 mg/dL — ABNORMAL HIGH (ref 70–99)
Glucose-Capillary: 138 mg/dL — ABNORMAL HIGH (ref 70–99)
Glucose-Capillary: 106 mg/dL — ABNORMAL HIGH (ref 70–99)

## 2019-08-10 MED ORDER — AMIODARONE IV BOLUS ONLY 150 MG/100ML
150.0000 mg | Freq: Once | INTRAVENOUS | Status: AC
  Administered 2019-08-10: 11:00:00 150 mg via INTRAVENOUS
  Filled 2019-08-10: qty 100

## 2019-08-10 NOTE — Progress Notes (Signed)
CARDIAC REHAB PHASE I   PRE:  Rate/Rhythm: 103 Afib  BP:  Sitting: 102/46      SaO2: 96 RA  MODE:  Ambulation: 470 ft   POST:  Rate/Rhythm: 129 Afib  BP:  Sitting: 111/47    SaO2: 97 RA  Pt mod assist to sit from lying position with reminders for sternal precautions. Pt able to rise and ambulate for 468ft in hallway with front wheel rolling walker. Pt c/o some difficulty breathing with mask on. Pt then helped to recliner, able to demonstrate 1250 on IS consistently. Pt requesting for walker for home use, RN made aware. Call bell and bedside table within reach. Encouraged walks and IS use. Will continue to follow.  [phone] Rufina Falco, RN BSN 08/10/2019 10:14 AM
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
## 2019-08-10 NOTE — Progress Notes (Addendum)
MD/PA please call daughter when able, she has questions about discharge thank you!   [phone]  [phone] cell   Paul Torres, Paul Torres

## 2019-08-10 NOTE — Progress Notes (Signed)
Patient ambulated in hallway with nursing staff and walker. Back in room call bell within reach. Jeffery Bachmeier, Bettina Gavia RN

## 2019-08-11 LAB — CBC
HCT: 29.9 % — ABNORMAL LOW (ref 39.0–52.0)
Hemoglobin: 10.1 g/dL — ABNORMAL LOW (ref 13.0–17.0)
MCH: 29.8 pg (ref 26.0–34.0)
MCHC: 33.8 g/dL (ref 30.0–36.0)
MCV: 88.2 fL (ref 80.0–100.0)
Platelets: 256 10*3/uL (ref 150–400)
RBC: 3.39 MIL/uL — ABNORMAL LOW (ref 4.22–5.81)
RDW: 13.8 % (ref 11.5–15.5)
WBC: 6.4 10*3/uL (ref 4.0–10.5)
nRBC: 0 % (ref 0.0–0.2)

## 2019-08-11 LAB — GLUCOSE, CAPILLARY
Glucose-Capillary: 95 mg/dL (ref 70–99)
Glucose-Capillary: 159 mg/dL — ABNORMAL HIGH (ref 70–99)
Glucose-Capillary: 144 mg/dL — ABNORMAL HIGH (ref 70–99)
Glucose-Capillary: 88 mg/dL (ref 70–99)

## 2019-08-11 LAB — BASIC METABOLIC PANEL
Anion gap: 10 (ref 5–15)
BUN: 27 mg/dL — ABNORMAL HIGH (ref 8–23)
CO2: 31 mmol/L (ref 22–32)
Calcium: 8.5 mg/dL — ABNORMAL LOW (ref 8.9–10.3)
Chloride: 97 mmol/L — ABNORMAL LOW (ref 98–111)
Creatinine, Ser: 2.21 mg/dL — ABNORMAL HIGH (ref 0.61–1.24)
GFR calc Af Amer: 33 mL/min — ABNORMAL LOW (ref >60)
GFR calc non Af Amer: 28 mL/min — ABNORMAL LOW (ref >60)
Glucose, Bld: 87 mg/dL (ref 70–99)
Potassium: 3.7 mmol/L (ref 3.5–5.1)
Sodium: 138 mmol/L (ref 135–145)

## 2019-08-11 NOTE — Plan of Care (Signed)
Poc progressing.  
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
## 2019-08-11 NOTE — Progress Notes (Signed)
EPW removed per order, ends intact. Pt instructed to remain on bedrest x 1 hour, pt voiced understanding. VSS. Will continue to monitor.

## 2019-08-11 NOTE — Progress Notes (Signed)
CARDIAC REHAB PHASE I   PRE:  Rate/Rhythm: 82 SR  BP:  Sitting: 122/58      SaO2: 95 RA  MODE:  Ambulation: 470 ft   POST:  Rate/Rhythm: 95 SR  BP:  Sitting: 114/54    SaO2: 96 RA  Pt ambulated 455ft in hallway independently with front wheel walker. Pt continued to need some reminders regarding sternal precautions. Pt still attached to external pacer. Daughter requesting a PA or MD call her for updates. She is currently at the bedside.   [phone] Rufina Falco, RN BSN 08/11/2019 10:43 AM

## 2019-08-11 NOTE — Progress Notes (Signed)
Spoke with PA ref to pacing box and wires. Box can be detached and wires can be pulled per PA. Will monitor and follow order. Jerald Kief, RN

## 2019-08-12 ENCOUNTER — Inpatient Hospital Stay (HOSPITAL_COMMUNITY): Payer: Medicare PPO

## 2019-08-12 LAB — BASIC METABOLIC PANEL
Anion gap: 13 (ref 5–15)
BUN: 27 mg/dL — ABNORMAL HIGH (ref 8–23)
CO2: 29 mmol/L (ref 22–32)
Calcium: 8.7 mg/dL — ABNORMAL LOW (ref 8.9–10.3)
Chloride: 95 mmol/L — ABNORMAL LOW (ref 98–111)
Creatinine, Ser: 2.01 mg/dL — ABNORMAL HIGH (ref 0.61–1.24)
GFR calc Af Amer: 37 mL/min — ABNORMAL LOW (ref >60)
GFR calc non Af Amer: 32 mL/min — ABNORMAL LOW (ref >60)
Glucose, Bld: 114 mg/dL — ABNORMAL HIGH (ref 70–99)
Potassium: 3.3 mmol/L — ABNORMAL LOW (ref 3.5–5.1)
Sodium: 137 mmol/L (ref 135–145)

## 2019-08-12 LAB — GLUCOSE, CAPILLARY: Glucose-Capillary: 111 mg/dL — ABNORMAL HIGH (ref 70–99)

## 2019-08-12 MED ORDER — ACETAMINOPHEN 325 MG PO TABS: 650.0000 mg | ORAL_TABLET | Freq: Four times a day (QID) | ORAL | Status: DC | PRN

## 2019-08-12 MED ORDER — AMIODARONE HCL 200 MG PO TABS: 200.0000 mg | ORAL_TABLET | Freq: Two times a day (BID) | ORAL | 2 refills | Status: DC

## 2019-08-12 MED ORDER — POTASSIUM CHLORIDE CRYS ER 20 MEQ PO TBCR
40.0000 meq | EXTENDED_RELEASE_TABLET | Freq: Once | ORAL | Status: AC
  Administered 2019-08-12: 40 meq via ORAL
  Filled 2019-08-12: qty 2

## 2019-08-12 MED FILL — Mannitol IV Soln 20%: INTRAVENOUS | Qty: 1000 | Status: AC

## 2019-08-12 MED FILL — Lidocaine HCl Local Soln Prefilled Syringe 100 MG/5ML (2%): INTRAMUSCULAR | Qty: 5 | Status: AC

## 2019-08-12 MED FILL — Sodium Bicarbonate IV Soln 8.4%: INTRAVENOUS | Qty: 100 | Status: AC

## 2019-08-12 MED FILL — Electrolyte-R (PH 7.4) Solution: INTRAVENOUS | Qty: 3000 | Status: AC

## 2019-08-12 MED FILL — Heparin Sodium (Porcine) Inj 1000 Unit/ML: INTRAMUSCULAR | Qty: 10 | Status: AC
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
## 2019-08-12 NOTE — Progress Notes (Signed)
[facility] will sign off.   Medication Recommendations:  Continue current medications  Other recommendations (labs, testing, etc):  N/A Follow up as an outpatient:  Has been arranged 

## 2019-08-12 NOTE — TOC Transition Note (Signed)
Transition of Care [facility]) - CM/SW Discharge Note Marvetta Gibbons RN, BSN Transitions of Care Unit 4E- RN Case Manager [phone]   Patient Details  Name: Paul Torres MRN: [id number] Date of Birth: [birth year]  Transition of Care [facility]) CM/SW Contact:  Dawayne Patricia, RN Phone Number: 08/12/2019, 11:49 AM   Clinical Narrative:    Pt stable for transition home today s/p CABG, per cardiac rehab pt need RW for home, order placed and call made to [place] with [place] for DME needs- RW to be delivered to room prior to discharge.    Final next level of care: Home/Self Care Barriers to Discharge: No Barriers Identified   Patient Goals and CMS Choice Patient states their goals for this hospitalization and ongoing recovery are:: home      Discharge Placement  Home/self care                     Discharge Plan and Services                DME Arranged: Walker rolling DME Agency: [employer] Date DME Agency Contacted: 08/12/19 Time DME Agency Contacted: 1000 Representative spoke with at DME Agency: [place]: NA [place] Agency: NA        Social Determinants of Health ([place]) Interventions     Readmission Risk Interventions No flowsheet data found.

## 2019-08-12 NOTE — Care Management Important Message (Signed)
Important Message  Patient Details  Name: Paul Torres MRN: [id number] Date of Birth: 01-May-1946   Medicare Important Message Given:  Yes     Shelda Altes 08/12/2019, 2:49 PM

## 2019-08-12 NOTE — Progress Notes (Signed)
08/12/2019 11:49 AM Discharged AVS meds taken today and those due this evening review. Follow up appointments and when to call MD reviewed. DC IV and Tele. Questions and concerns addressed.  Paulene Floor, RN

## 2019-08-12 NOTE — Progress Notes (Addendum)
CARDIAC REHAB PHASE I   D/c education completed with pt and daughter. Pt educated on importance of showers and monitoring incisions daily. Encouraged continued IS use, walks, sternal precautions. Pt given in-the-tube sheet along with heart healthy diet. Reviewed restrictions and exercise guidelines. Will send CRP II referral to [facility]. Pt waiting on front wheel walker.  [phone] Rufina Falco, RN BSN 08/12/2019 10:25 AM

## 2019-08-18 ENCOUNTER — Ambulatory Visit (INDEPENDENT_AMBULATORY_CARE_PROVIDER_SITE_OTHER): Payer: Self-pay

## 2019-08-18 ENCOUNTER — Other Ambulatory Visit: Payer: Self-pay

## 2019-08-18 VITALS — Temp 97.5°F

## 2019-08-18 DIAGNOSIS — Z951 Presence of aortocoronary bypass graft: Secondary | ICD-10-CM

## 2019-08-18 DIAGNOSIS — Z4802 Encounter for removal of sutures: Secondary | ICD-10-CM
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
## 2019-08-23 ENCOUNTER — Ambulatory Visit (INDEPENDENT_AMBULATORY_CARE_PROVIDER_SITE_OTHER): Payer: No Typology Code available for payment source

## 2019-08-23 ENCOUNTER — Ambulatory Visit (INDEPENDENT_AMBULATORY_CARE_PROVIDER_SITE_OTHER): Payer: No Typology Code available for payment source | Admitting: Cardiology

## 2019-08-23 ENCOUNTER — Other Ambulatory Visit: Payer: Self-pay

## 2019-08-23 ENCOUNTER — Encounter: Payer: Self-pay | Admitting: Cardiology

## 2019-08-23 VITALS — BP 124/50 | HR 81 | Ht 70.0 in | Wt 164.0 lb

## 2019-08-23 DIAGNOSIS — R42 Dizziness and giddiness: Secondary | ICD-10-CM | POA: Diagnosis not present

## 2019-08-23 DIAGNOSIS — I255 Ischemic cardiomyopathy: Secondary | ICD-10-CM | POA: Diagnosis not present

## 2019-08-23 DIAGNOSIS — I2510 Atherosclerotic heart disease of native coronary artery without angina pectoris: Secondary | ICD-10-CM | POA: Diagnosis not present

## 2019-08-23 DIAGNOSIS — I5020 Unspecified systolic (congestive) heart failure: Secondary | ICD-10-CM

## 2019-08-23 DIAGNOSIS — Z951 Presence of aortocoronary bypass graft: Secondary | ICD-10-CM | POA: Diagnosis not present

## 2019-08-23 NOTE — Patient Instructions (Signed)
Medication Instructions:  Your physician has recommended you make the following change in your medication:   DECRAESE: Amiodarone to 200 mg Daily  *If you need a refill on your cardiac medications before your next appointment, please call your pharmacy*  Lab Work: Your physician recommends that you return for lab work in: [place]  If you have labs (blood work) drawn today and your tests are completely normal, you will receive your results only by: Marland Kitchen MyChart Message (if you have MyChart) OR . A paper copy in the mail If you have any lab test that is abnormal or we need to change your treatment, we will call you to review the results.  Testing/Procedures: A zio monitor was placed today. It will remain on for 7 days. You will then return monitor and event diary in provided box. It takes 1-2 weeks for report to be downloaded and returned to [place]. We will call you with the results. If monitor falls off or has orange flashing light, please call Zio for further instructions.     Follow-Up: At [facility], you and your health needs are our priority.  As part of our continuing mission to provide you with exceptional heart care, we have created designated Provider Care Teams.  These Care Teams include your primary Cardiologist (physician) and Advanced Practice Providers (APPs -  Physician Assistants and Nurse Practitioners) who all work together to provide you with the care you need, when you need it.  Your next appointment:   1 month  The format for your next appointment:   In Person  Provider:   Berniece Salines, DO  Other Instructions
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
## 2019-08-24 LAB — HEPATIC FUNCTION PANEL
ALT: 11 IU/L (ref 0–44)
AST: 18 IU/L (ref 0–40)
Albumin: 3.9 g/dL (ref 3.7–4.7)
Alkaline Phosphatase: 86 IU/L (ref 39–117)
Bilirubin Total: 0.5 mg/dL (ref 0.0–1.2)
Bilirubin, Direct: 0.17 mg/dL (ref 0.00–0.40)
Total Protein: 6.4 g/dL (ref 6.0–8.5)

## 2019-08-24 LAB — BASIC METABOLIC PANEL
BUN/Creatinine Ratio: 9 — ABNORMAL LOW (ref 10–24)
BUN: 17 mg/dL (ref 8–27)
CO2: 23 mmol/L (ref 20–29)
Calcium: 9.5 mg/dL (ref 8.6–10.2)
Chloride: 103 mmol/L (ref 96–106)
Creatinine, Ser: 1.99 mg/dL — ABNORMAL HIGH (ref 0.76–1.27)
GFR calc Af Amer: 37 mL/min/{1.73_m2} — ABNORMAL LOW (ref >59)
GFR calc non Af Amer: 32 mL/min/{1.73_m2} — ABNORMAL LOW (ref >59)
Glucose: 126 mg/dL — ABNORMAL HIGH (ref 65–99)
Potassium: 4.3 mmol/L (ref 3.5–5.2)
Sodium: 140 mmol/L (ref 134–144)

## 2019-08-24 LAB — MAGNESIUM: Magnesium: 1.7 mg/dL (ref 1.6–2.3)

## 2019-08-25 ENCOUNTER — Ambulatory Visit (HOSPITAL_COMMUNITY)
Admit: 2019-08-25 | Discharge: 2019-08-25 | Disposition: A | Discharge status: Home / Self Care | Payer: Medicare PPO | Source: Ambulatory Visit | Attending: Nurse Practitioner | Admitting: Nurse Practitioner

## 2019-08-25 ENCOUNTER — Other Ambulatory Visit: Payer: Self-pay

## 2019-08-25 ENCOUNTER — Encounter (HOSPITAL_COMMUNITY): Payer: Self-pay | Admitting: Nurse Practitioner

## 2019-08-25 VITALS — BP 130/56 | HR 62 | Ht 70.0 in | Wt 160.6 lb

## 2019-08-25 DIAGNOSIS — Z794 Long term (current) use of insulin: Secondary | ICD-10-CM | POA: Diagnosis not present

## 2019-08-25 DIAGNOSIS — D6869 Other thrombophilia: Secondary | ICD-10-CM | POA: Diagnosis not present

## 2019-08-25 DIAGNOSIS — Z951 Presence of aortocoronary bypass graft: Secondary | ICD-10-CM | POA: Insufficient documentation

## 2019-08-25 DIAGNOSIS — Z87891 Personal history of nicotine dependence: Secondary | ICD-10-CM | POA: Insufficient documentation

## 2019-08-25 DIAGNOSIS — E785 Hyperlipidemia, unspecified: Secondary | ICD-10-CM | POA: Diagnosis not present

## 2019-08-25 DIAGNOSIS — Z7982 Long term (current) use of aspirin: Secondary | ICD-10-CM | POA: Diagnosis not present

## 2019-08-25 DIAGNOSIS — R55 Syncope and collapse: Secondary | ICD-10-CM | POA: Insufficient documentation

## 2019-08-25 DIAGNOSIS — I2510 Atherosclerotic heart disease of native coronary artery without angina pectoris: Secondary | ICD-10-CM | POA: Diagnosis not present

## 2019-08-25 DIAGNOSIS — Z7901 Long term (current) use of anticoagulants: Secondary | ICD-10-CM | POA: Diagnosis not present

## 2019-08-25 DIAGNOSIS — I10 Essential (primary) hypertension: Secondary | ICD-10-CM | POA: Insufficient documentation

## 2019-08-25 DIAGNOSIS — M109 Gout, unspecified: Secondary | ICD-10-CM | POA: Diagnosis not present

## 2019-08-25 DIAGNOSIS — I480 Paroxysmal atrial fibrillation: Secondary | ICD-10-CM | POA: Diagnosis not present

## 2019-08-25 DIAGNOSIS — E119 Type 2 diabetes mellitus without complications: Secondary | ICD-10-CM | POA: Insufficient documentation

## 2019-08-25 MED ORDER — APIXABAN 5 MG PO TABS: 5.0000 mg | ORAL_TABLET | Freq: Two times a day (BID) | ORAL | 2 refills | Status: DC

## 2019-08-25 NOTE — Patient Instructions (Signed)
Start eliquis 5mg  twice a day  Continue Amiodarone 200mg  once a day

## 2019-08-26 ENCOUNTER — Encounter (HOSPITAL_COMMUNITY): Payer: Self-pay | Admitting: Nurse Practitioner
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
## 2019-09-13 ENCOUNTER — Other Ambulatory Visit: Payer: Self-pay | Admitting: Surgery

## 2019-09-13 DIAGNOSIS — Z951 Presence of aortocoronary bypass graft: Secondary | ICD-10-CM

## 2019-09-14 ENCOUNTER — Emergency Department (HOSPITAL_COMMUNITY): Payer: Medicare PPO

## 2019-09-14 ENCOUNTER — Ambulatory Visit (HOSPITAL_COMMUNITY)
Admission: EM | Admit: 2019-09-14 | Discharge: 2019-09-15 | Disposition: A | Discharge status: Home / Self Care | Payer: Medicare PPO | Attending: Cardiology | Admitting: Cardiology

## 2019-09-14 ENCOUNTER — Encounter (HOSPITAL_COMMUNITY): Payer: Self-pay

## 2019-09-14 ENCOUNTER — Encounter (HOSPITAL_COMMUNITY)
Admission: EM | Disposition: A | Discharge status: Home / Self Care | Payer: Self-pay | Source: Home / Self Care | Attending: Emergency Medicine

## 2019-09-14 ENCOUNTER — Other Ambulatory Visit: Payer: Self-pay

## 2019-09-14 ENCOUNTER — Ambulatory Visit
Admission: RE | Admit: 2019-09-14 | Discharge: 2019-09-14 | Disposition: A | Discharge status: Home / Self Care | Payer: Non-veteran care | Source: Ambulatory Visit | Attending: Surgery | Admitting: Surgery

## 2019-09-14 ENCOUNTER — Ambulatory Visit (INDEPENDENT_AMBULATORY_CARE_PROVIDER_SITE_OTHER): Payer: Self-pay | Admitting: Surgery

## 2019-09-14 ENCOUNTER — Encounter: Payer: Self-pay | Admitting: Surgery

## 2019-09-14 VITALS — BP 147/70 | HR 64 | Temp 97.9°F | Resp 20 | Ht 69.0 in | Wt 167.8 lb

## 2019-09-14 DIAGNOSIS — R918 Other nonspecific abnormal finding of lung field: Secondary | ICD-10-CM | POA: Insufficient documentation

## 2019-09-14 DIAGNOSIS — I2510 Atherosclerotic heart disease of native coronary artery without angina pectoris: Secondary | ICD-10-CM | POA: Diagnosis not present

## 2019-09-14 DIAGNOSIS — Z950 Presence of cardiac pacemaker: Secondary | ICD-10-CM

## 2019-09-14 DIAGNOSIS — I440 Atrioventricular block, first degree: Secondary | ICD-10-CM | POA: Diagnosis not present

## 2019-09-14 DIAGNOSIS — R55 Syncope and collapse: Secondary | ICD-10-CM | POA: Diagnosis present

## 2019-09-14 DIAGNOSIS — Z794 Long term (current) use of insulin: Secondary | ICD-10-CM | POA: Diagnosis not present

## 2019-09-14 DIAGNOSIS — I129 Hypertensive chronic kidney disease with stage 1 through stage 4 chronic kidney disease, or unspecified chronic kidney disease: Secondary | ICD-10-CM | POA: Insufficient documentation

## 2019-09-14 DIAGNOSIS — Z7982 Long term (current) use of aspirin: Secondary | ICD-10-CM | POA: Insufficient documentation

## 2019-09-14 DIAGNOSIS — I495 Sick sinus syndrome: Secondary | ICD-10-CM | POA: Insufficient documentation

## 2019-09-14 DIAGNOSIS — N183 Chronic kidney disease, stage 3 unspecified: Secondary | ICD-10-CM | POA: Diagnosis not present

## 2019-09-14 DIAGNOSIS — I455 Other specified heart block: Secondary | ICD-10-CM

## 2019-09-14 DIAGNOSIS — Z79899 Other long term (current) drug therapy: Secondary | ICD-10-CM | POA: Insufficient documentation

## 2019-09-14 DIAGNOSIS — I469 Cardiac arrest, cause unspecified: Secondary | ICD-10-CM | POA: Insufficient documentation

## 2019-09-14 DIAGNOSIS — E785 Hyperlipidemia, unspecified: Secondary | ICD-10-CM | POA: Diagnosis not present

## 2019-09-14 DIAGNOSIS — Z951 Presence of aortocoronary bypass graft: Secondary | ICD-10-CM

## 2019-09-14 DIAGNOSIS — M109 Gout, unspecified: Secondary | ICD-10-CM | POA: Diagnosis not present

## 2019-09-14 DIAGNOSIS — Z87891 Personal history of nicotine dependence: Secondary | ICD-10-CM | POA: Insufficient documentation

## 2019-09-14 DIAGNOSIS — Z7901 Long term (current) use of anticoagulants: Secondary | ICD-10-CM | POA: Insufficient documentation

## 2019-09-14 DIAGNOSIS — E1122 Type 2 diabetes mellitus with diabetic chronic kidney disease: Secondary | ICD-10-CM | POA: Insufficient documentation

## 2019-09-14 DIAGNOSIS — I480 Paroxysmal atrial fibrillation: Secondary | ICD-10-CM | POA: Diagnosis not present

## 2019-09-14 DIAGNOSIS — Z20828 Contact with and (suspected) exposure to other viral communicable diseases: Secondary | ICD-10-CM | POA: Diagnosis not present

## 2019-09-14 HISTORY — PX: PACEMAKER IMPLANT: EP1218

## 2019-09-14 HISTORY — DX: Other specified heart block: I45.5

## 2019-09-14 LAB — COMPREHENSIVE METABOLIC PANEL
ALT: 11 U/L (ref 0–44)
AST: 17 U/L (ref 15–41)
Albumin: 3.3 g/dL — ABNORMAL LOW (ref 3.5–5.0)
Alkaline Phosphatase: 56 U/L (ref 38–126)
Anion gap: 11 (ref 5–15)
BUN: 12 mg/dL (ref 8–23)
CO2: 25 mmol/L (ref 22–32)
Calcium: 8.6 mg/dL — ABNORMAL LOW (ref 8.9–10.3)
Chloride: 102 mmol/L (ref 98–111)
Creatinine, Ser: 1.86 mg/dL — ABNORMAL HIGH (ref 0.61–1.24)
GFR calc Af Amer: 41 mL/min — ABNORMAL LOW (ref >60)
GFR calc non Af Amer: 35 mL/min — ABNORMAL LOW (ref >60)
Glucose, Bld: 132 mg/dL — ABNORMAL HIGH (ref 70–99)
Potassium: 3.2 mmol/L — ABNORMAL LOW (ref 3.5–5.1)
Sodium: 138 mmol/L (ref 135–145)
Total Bilirubin: 0.9 mg/dL (ref 0.3–1.2)
Total Protein: 6.2 g/dL — ABNORMAL LOW (ref 6.5–8.1)

## 2019-09-14 LAB — CBC WITH DIFFERENTIAL/PLATELET
Abs Immature Granulocytes: 0.08 10*3/uL — ABNORMAL HIGH (ref 0.00–0.07)
Basophils Absolute: 0.1 10*3/uL (ref 0.0–0.1)
Basophils Relative: 1 %
Eosinophils Absolute: 0.1 10*3/uL (ref 0.0–0.5)
Eosinophils Relative: 1 %
HCT: 35 % — ABNORMAL LOW (ref 39.0–52.0)
Hemoglobin: 11.5 g/dL — ABNORMAL LOW (ref 13.0–17.0)
Immature Granulocytes: 1 %
Lymphocytes Relative: 16 %
Lymphs Abs: 1.7 10*3/uL (ref 0.7–4.0)
MCH: 29.9 pg (ref 26.0–34.0)
MCHC: 32.9 g/dL (ref 30.0–36.0)
MCV: 90.9 fL (ref 80.0–100.0)
Monocytes Absolute: 0.7 10*3/uL (ref 0.1–1.0)
Monocytes Relative: 7 %
Neutro Abs: 7.8 10*3/uL — ABNORMAL HIGH (ref 1.7–7.7)
Neutrophils Relative %: 74 %
Platelets: 232 10*3/uL (ref 150–400)
RBC: 3.85 MIL/uL — ABNORMAL LOW (ref 4.22–5.81)
RDW: 14.2 % (ref 11.5–15.5)
WBC: 10.4 10*3/uL (ref 4.0–10.5)
nRBC: 0 % (ref 0.0–0.2)

## 2019-09-14 LAB — TROPONIN I (HIGH SENSITIVITY)
Troponin I (High Sensitivity): 15 ng/L (ref <18)
Troponin I (High Sensitivity): 57 ng/L — ABNORMAL HIGH (ref <18)

## 2019-09-14 LAB — GLUCOSE, CAPILLARY
Glucose-Capillary: 80 mg/dL (ref 70–99)
Glucose-Capillary: 116 mg/dL — ABNORMAL HIGH (ref 70–99)

## 2019-09-14 LAB — MAGNESIUM: Magnesium: 1.4 mg/dL — ABNORMAL LOW (ref 1.7–2.4)

## 2019-09-14 LAB — SARS CORONAVIRUS 2 (TAT 6-24 HRS): SARS Coronavirus 2: NEGATIVE

## 2019-09-14 LAB — POC SARS CORONAVIRUS 2 AG -  ED: SARS Coronavirus 2 Ag: NEGATIVE

## 2019-09-14 SURGERY — PACEMAKER IMPLANT

## 2019-09-14 MED ORDER — DORZOLAMIDE HCL 2 % OP SOLN
1.0000 [drp] | Freq: Two times a day (BID) | OPHTHALMIC | Status: DC
  Administered 2019-09-15: 1 [drp] via OPHTHALMIC
  Filled 2019-09-14: qty 10

## 2019-09-14 MED ORDER — SODIUM CHLORIDE 0.9 % IV SOLN
INTRAVENOUS | Status: DC
  Administered 2019-09-14: 17:00:00 via INTRAVENOUS

## 2019-09-14 MED ORDER — PRAVASTATIN SODIUM 10 MG PO TABS
10.0000 mg | ORAL_TABLET | Freq: Every day | ORAL | Status: DC
  Administered 2019-09-14: 10 mg via ORAL
  Filled 2019-09-14: qty 1

## 2019-09-14 MED ORDER — VITAMIN D 25 MCG (1000 UNIT) PO TABS
1000.0000 [IU] | ORAL_TABLET | Freq: Every day | ORAL | Status: DC
  Administered 2019-09-15: 1000 [IU] via ORAL
  Filled 2019-09-14: qty 1

## 2019-09-14 MED ORDER — HYDROMORPHONE HCL 1 MG/ML IJ SOLN: 1.0000 mg | INTRAMUSCULAR | Status: DC | PRN

## 2019-09-14 MED ORDER — POTASSIUM CHLORIDE CRYS ER 20 MEQ PO TBCR: 40.0000 meq | EXTENDED_RELEASE_TABLET | Freq: Once | ORAL | Status: DC

## 2019-09-14 MED ORDER — CEFAZOLIN SODIUM-DEXTROSE 2-4 GM/100ML-% IV SOLN
2.0000 g | INTRAVENOUS | Status: AC
  Administered 2019-09-14: 2 g via INTRAVENOUS

## 2019-09-14 MED ORDER — TRAMADOL HCL 50 MG PO TABS: 50.0000 mg | ORAL_TABLET | Freq: Three times a day (TID) | ORAL | Status: DC | PRN

## 2019-09-14 MED ORDER — INSULIN ASPART 100 UNIT/ML ~~LOC~~ SOLN: 0.0000 [IU] | Freq: Three times a day (TID) | SUBCUTANEOUS | Status: DC

## 2019-09-14 MED ORDER — SODIUM CHLORIDE 0.9 % IV SOLN: 250.0000 mL | INTRAVENOUS | Status: DC

## 2019-09-14 MED ORDER — METFORMIN HCL 500 MG PO TABS: 1000.0000 mg | ORAL_TABLET | Freq: Two times a day (BID) | ORAL | Status: DC

## 2019-09-14 MED ORDER — KETOTIFEN FUMARATE 0.025 % OP SOLN
1.0000 [drp] | Freq: Two times a day (BID) | OPHTHALMIC | Status: DC
  Administered 2019-09-15: 1 [drp] via OPHTHALMIC
  Filled 2019-09-14: qty 5

## 2019-09-14 MED ORDER — LIDOCAINE HCL (PF) 1 % IJ SOLN
INTRAMUSCULAR | Status: DC | PRN
  Administered 2019-09-14: 55 mL

## 2019-09-14 MED ORDER — ONDANSETRON HCL 4 MG/2ML IJ SOLN
4.0000 mg | Freq: Once | INTRAMUSCULAR | Status: AC
  Administered 2019-09-14: 4 mg via INTRAVENOUS
  Filled 2019-09-14: qty 2

## 2019-09-14 MED ORDER — AMIODARONE HCL 200 MG PO TABS
200.0000 mg | ORAL_TABLET | Freq: Every day | ORAL | Status: DC
  Administered 2019-09-15: 200 mg via ORAL
  Filled 2019-09-14: qty 1

## 2019-09-14 MED ORDER — CHLORHEXIDINE GLUCONATE 4 % EX LIQD
60.0000 mL | Freq: Once | CUTANEOUS | Status: DC
  Filled 2019-09-14: qty 60

## 2019-09-14 MED ORDER — ASPIRIN EC 81 MG PO TBEC
81.0000 mg | DELAYED_RELEASE_TABLET | Freq: Every day | ORAL | Status: DC
  Administered 2019-09-15: 81 mg via ORAL
  Filled 2019-09-14: qty 1

## 2019-09-14 MED ORDER — TRAZODONE HCL 50 MG PO TABS: 50.0000 mg | ORAL_TABLET | Freq: Every evening | ORAL | Status: DC | PRN

## 2019-09-14 MED ORDER — SODIUM CHLORIDE 0.9 % IV SOLN
INTRAVENOUS | Status: AC
  Filled 2019-09-14: qty 2

## 2019-09-14 MED ORDER — MAGNESIUM SULFATE 2 GM/50ML IV SOLN: 2.0000 g | Freq: Once | INTRAVENOUS | Status: DC

## 2019-09-14 MED ORDER — CEFAZOLIN SODIUM-DEXTROSE 2-4 GM/100ML-% IV SOLN
INTRAVENOUS | Status: AC
  Filled 2019-09-14: qty 100

## 2019-09-14 MED ORDER — SODIUM CHLORIDE 0.9% FLUSH: 3.0000 mL | INTRAVENOUS | Status: DC | PRN

## 2019-09-14 MED ORDER — TAMSULOSIN HCL 0.4 MG PO CAPS
0.4000 mg | ORAL_CAPSULE | Freq: Every day | ORAL | Status: DC
  Administered 2019-09-14: 0.4 mg via ORAL
  Filled 2019-09-14: qty 1

## 2019-09-14 MED ORDER — APIXABAN 5 MG PO TABS
5.0000 mg | ORAL_TABLET | Freq: Two times a day (BID) | ORAL | Status: DC
  Administered 2019-09-14 – 2019-09-15 (×2): 5 mg via ORAL
  Filled 2019-09-14 (×2): qty 1

## 2019-09-14 MED ORDER — DORZOLAMIDE HCL-TIMOLOL MAL PF 22.3-6.8 MG/ML OP SOLN: 1.0000 [drp] | Freq: Two times a day (BID) | OPHTHALMIC | Status: DC

## 2019-09-14 MED ORDER — HEPARIN (PORCINE) IN NACL 2-0.9 UNITS/ML
INTRAMUSCULAR | Status: AC | PRN
  Administered 2019-09-14: 500 mL

## 2019-09-14 MED ORDER — POLYVINYL ALCOHOL 1.4 % OP SOLN
1.0000 [drp] | Freq: Four times a day (QID) | OPHTHALMIC | Status: DC
  Administered 2019-09-15 (×2): 1 [drp] via OPHTHALMIC
  Filled 2019-09-14: qty 15

## 2019-09-14 MED ORDER — SODIUM CHLORIDE 0.9 % IV BOLUS
1000.0000 mL | Freq: Once | INTRAVENOUS | Status: AC
  Administered 2019-09-14: 1000 mL via INTRAVENOUS

## 2019-09-14 MED ORDER — ACETAMINOPHEN 325 MG PO TABS: 325.0000 mg | ORAL_TABLET | ORAL | Status: DC | PRN

## 2019-09-14 MED ORDER — VITAMIN B-12 100 MCG PO TABS
500.0000 ug | ORAL_TABLET | Freq: Every day | ORAL | Status: DC
  Administered 2019-09-15: 500 ug via ORAL
  Filled 2019-09-14: qty 5

## 2019-09-14 MED ORDER — ONDANSETRON HCL 4 MG/2ML IJ SOLN: 4.0000 mg | Freq: Four times a day (QID) | INTRAMUSCULAR | Status: DC | PRN

## 2019-09-14 MED ORDER — SODIUM CHLORIDE 0.9 % IV SOLN
80.0000 mg | INTRAVENOUS | Status: AC
  Administered 2019-09-14: 80 mg
  Filled 2019-09-14: qty 2

## 2019-09-14 MED ORDER — ONDANSETRON 4 MG PO TBDP: 4.0000 mg | ORAL_TABLET | Freq: Once | ORAL | Status: DC

## 2019-09-14 MED ORDER — FLUTICASONE PROPIONATE 50 MCG/ACT NA SUSP
2.0000 | Freq: Every day | NASAL | Status: DC
  Administered 2019-09-15: 2 via NASAL
  Filled 2019-09-14: qty 16

## 2019-09-14 MED ORDER — SODIUM CHLORIDE 0.9% FLUSH
3.0000 mL | Freq: Two times a day (BID) | INTRAVENOUS | Status: DC
  Administered 2019-09-14 – 2019-09-15 (×2): 3 mL via INTRAVENOUS

## 2019-09-14 MED ORDER — INSULIN ASPART 100 UNIT/ML ~~LOC~~ SOLN: 0.0000 [IU] | Freq: Every day | SUBCUTANEOUS | Status: DC

## 2019-09-14 MED ORDER — CEFAZOLIN SODIUM-DEXTROSE 1-4 GM/50ML-% IV SOLN
1.0000 g | Freq: Four times a day (QID) | INTRAVENOUS | Status: AC
  Administered 2019-09-14 – 2019-09-15 (×3): 1 g via INTRAVENOUS
  Filled 2019-09-14 (×3): qty 50

## 2019-09-14 MED ORDER — TIMOLOL MALEATE 0.5 % OP SOLN
1.0000 [drp] | Freq: Two times a day (BID) | OPHTHALMIC | Status: DC
  Administered 2019-09-14 – 2019-09-15 (×2): 1 [drp] via OPHTHALMIC
  Filled 2019-09-14: qty 5

## 2019-09-14 MED ORDER — PANTOPRAZOLE SODIUM 40 MG PO TBEC
40.0000 mg | DELAYED_RELEASE_TABLET | Freq: Every day | ORAL | Status: DC
  Administered 2019-09-15: 40 mg via ORAL
  Filled 2019-09-14: qty 1

## 2019-09-14 SURGICAL SUPPLY — 8 items
CABLE SURGICAL S-101-97-12 (CABLE) ×3 IMPLANT
IPG PACE AZUR XT DR MRI W1DR01 (Pacemaker) ×1 IMPLANT
LEAD CAPSURE NOVUS 5076-52CM (Lead) ×3 IMPLANT
LEAD CAPSURE NOVUS 5076-58CM (Lead) ×3 IMPLANT
PACE AZURE XT DR MRI W1DR01 (Pacemaker) ×3 IMPLANT
PAD PRO RADIOLUCENT 2001M-C (PAD) ×3 IMPLANT
SHEATH 7FR PRELUDE SNAP 13 (SHEATH) ×6 IMPLANT
TRAY PACEMAKER INSERTION (PACKS) ×3 IMPLANT
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
## 2019-09-14 NOTE — ED Triage Notes (Signed)
Pt alert and oriented denies any pain.  States feeling well prior to going to eat.  Skin pale warm and dry without diaphoresis.

## 2019-09-14 NOTE — H&P (Signed)
Please see consult note for H&P  Tommye Standard, PA-C

## 2019-09-14 NOTE — ED Notes (Signed)
Pt had another episode of loss of HR and pulse.  Felt it coming and became latyhargic and states here it comes.  Compressions done for 30 seconds and pulse returned.   External pacemaker applied with MA at 38 and HR set to 60.  Pt returned to alert and without acute distress.  Oxygen also placed by [facility].

## 2019-09-14 NOTE — ED Triage Notes (Signed)
Pt had syncopal episode in restaurant with normal HR and strong pulses.  Hx open heart x 1 month ago.   On arrival to the ED pt became gray and ashen, lost pulses and showed asystole, lasting only a minute with compressions done briefly.

## 2019-09-14 NOTE — ED Notes (Signed)
Pt restful with daughter in room, remains on zoll monitor at this time.  Attempt at second IV x 2 unsuccessful to R AC.  Denies any pain or nausea.
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
## 2019-09-14 NOTE — Consult Note (Addendum)
H&P:   Patient ID: Paul Torres MRN: [id number]; DOB: 05-05-1946  Admit date: 09/14/2019 Date of Consult: 09/14/2019  Primary Care Provider: Sydnee Cabal, MD Primary Cardiologist: Berniece Salines, DO  Primary Electrophysiologist:  None    Patient Profile:   Paul Torres is a 73 y.o. male with a hx of DM2, HTN, dyslipidemia, gout, prior tobacco use, CAD s/p CABGx 5 08/04/19, post-operative afib converted with amiodarone who is being seen today for the evaluation of pauses and asystole at the request of Dr. Langston Masker.  History of Present Illness:   Paul Torres had not seen cardiology this year until October he had a syncopal episode and hit a tree and was brought in to the ER at [place].    In the ED documented asystole pauses up to 15 seconds. EKG was concerning for RCA infarct and Q waves in the inferior leads and he was transferred to [facility] for LHC. At [facility] he had a temporary pacemaker placed. Cath showed severe CAD, Echo showed inferior wall akinesis likely prior infarction and EF 50%  and ultimately underwent CABG 08/04/19. He had brief event of post-op afib converted with amiodarone, intermittent bradycardia, pauses were observed, he was seen by EP, though he regained sinus rhythm with out further significant bradycardia and felt PPM not required at that time, maintained on amiodarone  Patient  Followed up in the office with Dr. Harriet Masson 08/23/2019, the pt mentioned some dizziness,  His amio was reduced to 200 mg daily.   CHADSVASC 3 and he was started on Eliquis via the Afib clinic   Patient presented to the ED 09/14/19 for syncopal event.  Went While at [employer] passed out. 911 was called and he was transported to the ED. In review of ER notes he lost consciousness again on arrival to ER. EMS reported to MD in the ER observed asystole and CPR was performed for about a minute with ROSC. Denied chest pain.   In the ER he has had sinus arrest/asytole as long at 10 and 13 seconds  associated with LOC chest compressions regained sinus rhythm, Transcutaneous pacing pads placed and on zoll  Cardiology was called, EP asked to weigh in given past bradycardia. He is on amiodarone, no other potential nodal blocking agents  BP has been stable Labs POC COVID verbally reported as negative K+ 3.2 Mag 1.4 HS Trop 15 BUN/Creat 12/1.86 WBC 10.4 H/H 11/35 Plts 232  The patient in d/w Dr. Curt Bears denied any CP, he has not had syncope since prior to his CABG.   Heart Pathway Score:     Past Medical History:  Diagnosis Date  . Diabetes mellitus without complication ([place])   . Gout   . Hyperlipemia 08/03/2019  . Hypertension   . Syncope     Past Surgical History:  Procedure Laterality Date  . CORONARY ARTERY BYPASS GRAFT N/A 08/04/2019   Procedure: CORONARY ARTERY BYPASS GRAFTING (CABG) x 5 WITH ENDOSCOPIC HARVESTING OF RIGHT GREATER SAPHENOUS VEIN. LIMA TO LAD;  Surgeon: Gaye Pollack, MD;  Location: [facility] OR;  Service: Open Heart Surgery;  Laterality: N/A;  . LEFT HEART CATH AND CORONARY ANGIOGRAPHY N/A 08/03/2019   Procedure: LEFT HEART CATH AND CORONARY ANGIOGRAPHY;  Surgeon: Belva Crome, MD;  Location: [place] CV LAB;  Service: Cardiovascular;  Laterality: N/A;  . TEE WITHOUT CARDIOVERSION N/A 08/04/2019   Procedure: TRANSESOPHAGEAL ECHOCARDIOGRAM (TEE);  Surgeon: Gaye Pollack, MD;  Location: [place];  Service: Open Heart Surgery;  Laterality: N/A;  .  TEMPORARY PACEMAKER N/A 08/03/2019   Procedure: TEMPORARY PACEMAKER;  Surgeon: Lyn Records, MD;  Location: [facility] INVASIVE CV LAB;  Service: Cardiovascular;  Laterality: N/A;     Home Medications:  Prior to Admission medications   Medication Sig Start Date End Date Taking? Authorizing Provider  acetaminophen (TYLENOL) 325 MG tablet Take 2 tablets (650 mg total) by mouth every 6 (six) hours as needed for mild pain. Patient taking differently: Take 650 mg by mouth every 6 (six) hours as needed for mild pain.  Taking two tablets at bedtime 08/12/19   Leary Roca, PA-C  amiodarone (PACERONE) 200 MG tablet Take 1 tablet (200 mg total) by mouth 2 (two) times daily. Patient taking differently: Take 200 mg by mouth daily. Taking one tablet daily 08/12/19   Leary Roca, PA-C  apixaban (ELIQUIS) 5 MG TABS tablet Take 1 tablet (5 mg total) by mouth 2 (two) times daily. 08/25/19   Newman Nip, NP  aspirin EC 81 MG tablet Take 81 mg by mouth daily as needed.    [provider]  Carboxymethylcellulose Sod PF 0.25 % SOLN Place 1 drop into both eyes 4 (four) times daily.    [provider]  Carboxymethylcellulose Sod PF 1 % GEL Place 1 application into both eyes as needed.     [provider]  Cholecalciferol (VITAMIN D3) 250 MCG (10000 UT) TABS Take 1 tablet by mouth daily.    [provider]  dorzolamidel-timolol (COSOPT) 22.3-6.8 MG/ML SOLN ophthalmic solution Place 1 drop into both eyes 2 (two) times daily.    [provider]  fluticasone (FLONASE) 50 MCG/ACT nasal spray Place 2 sprays into both nostrils daily.    [provider]  glipiZIDE (GLUCOTROL) 10 MG tablet Take 10 mg by mouth as needed.     [provider]  insulin aspart (NOVOLOG FLEXPEN) 100 UNIT/ML FlexPen Inject 5 Units into the skin as needed.     [provider]  ketotifen (ZADITOR) 0.025 % ophthalmic solution Place 1 drop into both eyes 2 (two) times daily.    [provider]  metFORMIN (GLUCOPHAGE) 1000 MG tablet Take 1,000 mg by mouth 2 (two) times daily with a meal.    [provider]  Omega-3 Fatty Acids (FISH OIL) 1000 MG CAPS Take 2,000 mg by mouth daily.    [provider]  omeprazole (PRILOSEC) 20 MG capsule Take 20 mg by mouth daily.    [provider]  pravastatin (PRAVACHOL) 10 MG tablet Take 10 mg by mouth at bedtime.    [provider]  tamsulosin (FLOMAX) 0.4 MG CAPS capsule Take 0.4 mg by mouth at  bedtime.    [provider]  traMADol (ULTRAM) 50 MG tablet Take 50 mg by mouth as needed for moderate pain.     [provider]  traZODone (DESYREL) 50 MG tablet Take 50 mg by mouth as needed.     [provider]  vitamin B-12 (CYANOCOBALAMIN) 500 MCG tablet Take 500 mcg by mouth daily.    [provider]    Inpatient Medications: Scheduled Meds:  Continuous Infusions:  PRN Meds:   Allergies:   No Known Allergies  Social History:   Social History   Socioeconomic History  . Marital status: Married    Spouse name: Not on file  . Number of children: Not on file  . Years of education: Not on file  . Highest education level: Not on file  Occupational History  .  Not on file  Social Needs  . Financial resource strain: Not on file  . Food insecurity    Worry: Not on file    Inability: Not on file  . Transportation needs    Medical: Not on file    Non-medical: Not on file  Tobacco Use  . Smoking status: Former Games developermoker  . Smokeless tobacco: Never Used  Substance and Sexual Activity  . Alcohol use: Not Currently  . Drug use: Not on file  . Sexual activity: Not on file  Lifestyle  . Physical activity    Days per week: Not on file    Minutes per session: Not on file  . Stress: Not on file  Relationships  . Social Musicianconnections    Talks on phone: Not on file    Gets together: Not on file    Attends religious service: Not on file    Active member of club or organization: Not on file    Attends meetings of clubs or organizations: Not on file    Relationship status: Not on file  . Intimate partner violence    Fear of current or ex partner: Not on file    Emotionally abused: Not on file    Physically abused: Not on file    Forced sexual activity: Not on file  Other Topics Concern  . Not on file  Social History Narrative  . Not on file    Family History:   Patient is not feeling well,  obtain at a later time   ROS:  Please see  the history of present illness.  All other ROS reviewed and negative.     Physical Exam/Data:   Vitals:   09/14/19 1500 09/14/19 1515 09/14/19 1515 09/14/19 1530  BP: (!) 151/64 (!) 141/62  (!) 145/62  Pulse: 68 66  64  Resp: 18 15  (!) 27  Temp:   98.2 F (36.8 C)   TempSrc:   Oral   SpO2: 96% 97%  100%  Weight:      Height:        Intake/Output Summary (Last 24 hours) at 09/14/2019 1532 Last data filed at 09/14/2019 1422 Gross per 24 hour  Intake 20 ml  Output 1 ml  Net 19 ml   Last 3 Weights 09/14/2019 09/14/2019 08/25/2019  Weight (lbs) 167 lb 12.3 oz 167 lb 12.8 oz 160 lb 9.6 oz  Weight (kg) 76.1 kg 76.114 kg 72.848 kg     Body mass index is 24.07 kg/m.  General:  Well nourished, well developed, in no acute distress, chronically ill appearing HEENT: normal Lymph: no adenopathy Neck: no JVD Endocrine:  No thryomegaly Vascular: No carotid bruits  Cardiac:  RRR; no murmurs, gallops or rubs Lungs:  CTA b/l, no wheezing, rhonchi or rales  Abd: soft, nontender Ext: no edema Musculoskeletal:  No deformities, age appropriate atrophy, frail body habitus Skin: warm and dry  Neuro:  No focal abnormalities noted Psych:  Normal affect   EKG:  The EKG was personally reviewed and demonstrates:    SR 69bpm, 1st degree AVblock, PR 240ms SB 51bpm, PR 200ms  Telemetry:  Telemetry was personally reviewed and demonstrates:   SB with sinus arrest > transcutaneous pacing    Relevant CV Studies:  09/09/2019: Zio patch monitor (6 days) The minimum heart rate was 48 bpm, maximum heart rate was 101  bpm, and average heart rate was 62 bpm. Predominant underlying rhythm was Sinus Rhythm.  Premature atrial complexes were were rare (<  1.0%). Premature Ventricular complexes were rare (<1.0%), and no VE Triplets were present. Ventricular Bigeminy and Trigeminy were present.  No ventricular tachycardia, No pauses, No AV block No supraventricular tachycardia and no atrial fibrillation  present. No patient triggered events noted.    TEE intraoperative POST-OP IMPRESSIONS - Left Ventricle: The left ventricle is unchanged from pre-bypass. - Aorta: The aorta appears unchanged from pre-bypass. - Left Atrial Appendage: The left atrial appendage appears unchanged from pre-bypass. - Aortic Valve: The aortic valve appears unchanged from pre-bypass. - Mitral Valve: The mitral valve appears unchanged from pre-bypass. - Tricuspid Valve: The tricuspid valve appears unchanged from pre-bypass. - Interatrial Septum: The interatrial septum appears unchanged from pre-bypass.  SUMMARY  Post Bypass:  - Tricuspid valve unchanged. - Pulmonic valve unchanged. - Mitral valve unchanged. - Aortic valve unchanged. - LV function unchanged. RV function improved. - CO > 4, CI > 2, minimal vasopressor support. - No dissection noted after cannula removed. PRE-OP FINDINGS Left Ventricle: The left ventricle has moderately reduced systolic function, with an ejection fraction of 35-40%. The cavity size was mildly dilated. There is no increase in left ventricular wall thickness. Left ventrical global hypokinesis without  regional wall motion abnormalities.  Right Ventricle: The right ventricle has mildly reduced systolic function. The cavity was mildly enlarged. There is no increase in right ventricular wall thickness. Right ventricular systolic pressure could not be assessed.  Left Atrium: Left atrial size was dilated. The left atrial appendage is well visualized and there is no evidence of thrombus present. Left atrial appendage velocity is normal at greater than 40 cm/s.  Right Atrium: Right atrial size was normal in size. Right atrial pressure is estimated at 10 mmHg.  Interatrial Septum: No atrial level shunt detected by color flow Doppler.  Pericardium: A small pericardial effusion is present. The pericardial effusion is posterior and lateral to the left ventricle and posterior to  the left ventricle. There is no evidence of cardiac tamponade. There is no pleural effusion.  Mitral Valve: The mitral valve is normal in structure. Mild thickening of the mitral valve leaflet. Mild calcification of the mitral valve leaflet. Mitral valve regurgitation is trivial by color flow Doppler. The MR jet is centrally-directed. There is no evidence of mitral valve vegetation.  Tricuspid Valve: The tricuspid valve was normal in structure. Tricuspid valve regurgitation is trivial by color flow Doppler. The jet is directed centrally. No TV vegetation was visualized.  Aortic Valve: The aortic valve is tricuspid There is Mild thickening of the aortic valve Aortic valve regurgitation was not visualized by color flow Doppler. There is no evidence of aortic valve stenosis. There is no evidence of a vegetation on the  aortic valve.  Pulmonic Valve: The pulmonic valve was normal in structure. Pulmonic valve regurgitation is not visualized by color flow Doppler.   Aorta: The aortic root, ascending aorta and aortic arch are normal in size and structure. There is evidence of layered plaque in the descending aorta; Grade II, measuring 2-3mm in size.  Pulmonary Artery: The pulmonary artery is of normal size and origin.    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  Total occlusion of the right coronary filling late by collaterals from the left coronary.  Widely patent left main. Unable to determine if the left main courses between the pulmonary artery and aorta.  95 to 99% proximal LAD stenosis. Mid to distal LAD is diffusely diseased up to 80% and may be intramyocardial.  A large ramus intermedius contains proximal 70 to 80% stenosis.  Circumflex distribution is supplied by a vessel that has segmental 90%  calcified stenosis before branching onto the lateral wall.  The distal circumflex territory is not well visualized and appears to be from a retroaortic vessel arising from the right coronary. No significant branches are noted on the left inferolateral wall.  LVEDP was normal. Echocardiogram done earlier today suggests inferior wall akinesis. EF 60%.  Successful placement of temporary transvenous pacemaker from right femoral vein. High threshold. RECOMMENDATIONS: TCTS consultation for surgical revascularization.  Consider coronary CT to determine course of left main although if bypass surgery is going to be performed, it may not serve any benefit.  EP consultation.  Laboratory Data:  High Sensitivity Troponin:  No results for input(s): TROPONINIHS in the last 720 hours.   ChemistryNo results for input(s): NA, K, CL, CO2, GLUCOSE, BUN, CREATININE, CALCIUM, GFRNONAA, GFRAA, ANIONGAP in the last 168 hours.  No results for input(s): PROT, ALBUMIN, AST, ALT, ALKPHOS, BILITOT in the last 168 hours. Hematology Recent Labs  Lab 09/14/19 1433  WBC 10.4  RBC 3.85*  HGB 11.5*  HCT 35.0*  MCV 90.9  MCH 29.9  MCHC 32.9  RDW 14.2  PLT 232   BNPNo results for input(s): BNP, PROBNP in the last 168 hours.  DDimer No results for input(s): DDIMER in the last 168 hours.   Radiology/Studies:   Dg Chest 2 View Result Date: 09/14/2019 CLINICAL DATA:  Status post coronary bypass graft. EXAM: CHEST - 2 VIEW COMPARISON:  August 12, 2019. FINDINGS: Stable cardiomediastinal silhouette. Status post coronary bypass graft. No pneumothorax is noted. Right lung is clear. Mild left basilar subsegmental atelectasis is noted with probable small left pleural effusion. Bony thorax is unremarkable. IMPRESSION: Stable mild left basilar subsegmental atelectasis with probable small left pleural effusion. No pneumothorax is noted status post coronary bypass graft. Electronically Signed   By: Lupita Raider M.D.    On: 09/14/2019 09:55    Dg Chest Port 1 View Result Date: 09/14/2019 CLINICAL DATA:  Syncope EXAM: PORTABLE CHEST 1 VIEW COMPARISON:  Radiograph 09/14/2019 FINDINGS: Postsurgical changes related to prior CABG including intact and aligned sternotomy wires and multiple surgical clips projecting over the mediastinum. Low lung volumes with streaky opacities favoring atelectasis. No convincing features of edema. No pneumothorax or visible effusion. Mild cardiomegaly, similar to priors accounting for differences in technique and lung inflation. No acute osseous or soft tissue abnormality. Degenerative changes are present in the imaged spine and shoulders. Overlying support devices and pacer pads are noted. IMPRESSION: 1. Low lung volumes with streaky opacities favoring atelectasis. 2. Stable mild cardiomegaly without edema or effusion. 3. Stable postoperative changes related to prior CABG. Electronically Signed   By: Kreg Shropshire M.D.   On: 09/14/2019 14:56    Assessment and Plan:   1. Syncope     Sinus arrest     He is on amiodarone though  need for his AFib management, and has been loaded      order electrolyte replacement, though not felt to be contributing to his sinus node disease  Dr. Elberta Fortis discussed with the patient and daughter at bedside PPM recommendation the procedure, potential risks and benefits they are agreeable to proceed Do not anticipate V pacing with intact AV conduction No plans for CRT  2. CAD  No anginal complaints     HS trop 15     Continue home meds post procedure  3. Paroxysmal Afib     CHA2DS2Vasc is 5, on Eliquis out patient      plan to resume post-op, pending site  4. HTN     Resume home meds post pacing  5. DM     Resume home meds post pacing  6. ICM     Could add BB post pacing, renal function not likely to allow ACE/ARB      defer to Dr. Servando Salina  Outpatient     He does not appear volume OL currently     For questions or updates,  please contact [facility] Please consult [email or web address] for contact info under     Signed, Cadence David Stall, PA-C  09/14/2019 3:32 PM  Francis Dowse, PA-C  I have seen and examined this patient with Cadence Sherryle Lis.  Agree with above, note added to reflect my findings.  On exam, RRR, no murmurs, lungs clear. Patient presented with sinus arrest and syncope. No causes noted.  plan for pacemaker implant.   Paul Torres has presented today for surgery, with the diagnosis of sinus arrest.  The various methods of treatment have been discussed with the patient and family. After consideration of risks, benefits and other options for treatment, the patient has consented to  Procedure(s): Pacemaker implant as a surgical intervention .  Risks include but not limited to bleeding, tamponade, infection, pneumothorax, among others. The patient's history has been reviewed, patient examined, no change in status, stable for surgery.  I have reviewed the patient's chart and labs.  Questions were answered to the patient's satisfaction.     Elberta Fortis, MD 09/14/2019 4:41 PM      M.  MD 09/14/2019 4:40 PM

## 2019-09-15 ENCOUNTER — Ambulatory Visit (HOSPITAL_COMMUNITY): Payer: Medicare PPO

## 2019-09-15 DIAGNOSIS — I129 Hypertensive chronic kidney disease with stage 1 through stage 4 chronic kidney disease, or unspecified chronic kidney disease: Secondary | ICD-10-CM | POA: Diagnosis not present

## 2019-09-15 DIAGNOSIS — I469 Cardiac arrest, cause unspecified: Secondary | ICD-10-CM | POA: Diagnosis not present

## 2019-09-15 DIAGNOSIS — Z20828 Contact with and (suspected) exposure to other viral communicable diseases: Secondary | ICD-10-CM | POA: Diagnosis not present

## 2019-09-15 DIAGNOSIS — I455 Other specified heart block: Secondary | ICD-10-CM | POA: Diagnosis not present

## 2019-09-15 DIAGNOSIS — R55 Syncope and collapse: Secondary | ICD-10-CM | POA: Diagnosis not present

## 2019-09-15 LAB — GLUCOSE, CAPILLARY
Glucose-Capillary: 91 mg/dL (ref 70–99)
Glucose-Capillary: 107 mg/dL — ABNORMAL HIGH (ref 70–99)

## 2019-09-15 MED ORDER — POTASSIUM CHLORIDE CRYS ER 20 MEQ PO TBCR
40.0000 meq | EXTENDED_RELEASE_TABLET | Freq: Once | ORAL | Status: AC
  Administered 2019-09-15: 40 meq via ORAL
  Filled 2019-09-15: qty 2

## 2019-09-15 MED ORDER — AMIODARONE HCL 200 MG PO TABS: 200.0000 mg | ORAL_TABLET | Freq: Every day | ORAL | 3 refills | Status: DC

## 2019-09-15 MED ORDER — MAGNESIUM SULFATE IN D5W 1-5 GM/100ML-% IV SOLN
1.0000 g | Freq: Once | INTRAVENOUS | Status: AC
  Administered 2019-09-15: 1 g via INTRAVENOUS
  Filled 2019-09-15 (×2): qty 100

## 2019-09-15 NOTE — Plan of Care (Signed)
  Problem: Education: Goal: Knowledge of General Education information will improve Description Including pain rating scale, medication(s)/side effects and non-pharmacologic comfort measures Outcome: Progressing   Problem: Clinical Measurements: Goal: Will remain free from infection Outcome: Progressing Goal: Respiratory complications will improve Outcome: Progressing   Problem: Nutrition: Goal: Adequate nutrition will be maintained Outcome: Progressing   Problem: Coping: Goal: Level of anxiety will decrease Outcome: Progressing   Problem: Safety: Goal: Ability to remain free from injury will improve Outcome: Progressing   

## 2019-09-15 NOTE — Plan of Care (Signed)
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
## 2019-09-15 NOTE — Discharge Instructions (Signed)
° ° °  Supplemental Discharge Instructions for  Pacemaker Patients  Activity No heavy lifting or vigorous activity with your left/right arm for 6 to 8 weeks.  Do not raise your left/right arm above your head for one week.  Gradually raise your affected arm as drawn below.               09/18/2019              09/19/2019              09/20/2019            09/21/2019 __  NO DRIVING untiul cleared to by Dr. Harriet Masson .  WOUND CARE - Keep the wound area clean and dry.  Do not get this area wet, no showers until cleared to at your wound check visit. - The tape/steri-strips on your wound will fall off; do not pull them off.  No bandage is needed on the site.  DO  NOT apply any creams, oils, or ointments to the wound area. - If you notice any drainage or discharge from the wound, any swelling or bruising at the site, or you develop a fever > 101? F after you are discharged home, call the office at once.  Special Instructions - You are still able to use cellular telephones; use the ear opposite the side where you have your pacemaker/defibrillator.  Avoid carrying your cellular phone near your device. - When traveling through airports, show security personnel your identification card to avoid being screened in the metal detectors.  Ask the security personnel to use the hand wand. - Avoid arc welding equipment, MRI testing (magnetic resonance imaging), TENS units (transcutaneous nerve stimulators).  Call the office for questions about other devices. - Avoid electrical appliances that are in poor condition or are not properly grounded. - Microwave ovens are safe to be near or to operate.

## 2019-09-15 NOTE — Care Management (Signed)
09-15-19 1228 CM spoke with patient and he is a member of the [facility]. Patient see's MD Marcello Moores. CM received verbal permission from patient to notify [place] that the patient is here in the hospital. CM did call the Fees Coordinator to make them aware of patient's hospitalization. CM is awaiting call back from Fees Coordinator to get a fax to MD Marcello Moores to send D/c summary. No further needs noted from CM at this time. Bethena Roys, RN,BSN Case Manager [phone]

## 2019-09-15 NOTE — TOC Progression Note (Addendum)
Transition of Care [facility]) - Progression Note    Patient Details  Name: Paul Torres MRN: [id number] Date of Birth: Aug 12, 1946  Transition of [facility]) CM/SW Contact  Zenon Mayo, RN Phone Number: 09/15/2019, 2:52 PM  Clinical Narrative:    NCM received call from [place], stating CSW is [facility] [phone] [place]. Will need to call her to get fax number.          Expected Discharge Plan and Services           Expected Discharge Date: 09/15/19                                     Social Determinants of Health (SDOH) Interventions    Readmission Risk Interventions No flowsheet data found.

## 2019-09-22 ENCOUNTER — Ambulatory Visit (INDEPENDENT_AMBULATORY_CARE_PROVIDER_SITE_OTHER): Payer: Medicare PPO | Admitting: Cardiology

## 2019-09-22 ENCOUNTER — Other Ambulatory Visit: Payer: Self-pay

## 2019-09-22 ENCOUNTER — Encounter: Payer: Self-pay | Admitting: Cardiology

## 2019-09-22 VITALS — BP 152/70 | HR 63 | Ht 70.0 in | Wt 168.0 lb

## 2019-09-22 DIAGNOSIS — E782 Mixed hyperlipidemia: Secondary | ICD-10-CM

## 2019-09-22 DIAGNOSIS — I10 Essential (primary) hypertension: Secondary | ICD-10-CM | POA: Diagnosis not present

## 2019-09-22 DIAGNOSIS — I480 Paroxysmal atrial fibrillation: Secondary | ICD-10-CM

## 2019-09-22 DIAGNOSIS — I2510 Atherosclerotic heart disease of native coronary artery without angina pectoris: Secondary | ICD-10-CM | POA: Diagnosis not present

## 2019-09-22 NOTE — Patient Instructions (Signed)
Medication Instructions:  Your physician recommends that you continue on your current medications as directed. Please refer to the Current Medication list given to you today.  *If you need a refill on your cardiac medications before your next appointment, please call your pharmacy*  Lab Work: None If you have labs (blood work) drawn today and your tests are completely normal, you will receive your results only by: Marland Kitchen MyChart Message (if you have MyChart) OR . A paper copy in the mail If you have any lab test that is abnormal or we need to change your treatment, we will call you to review the results.  Testing/Procedures: None  Follow-Up: At [facility], you and your health needs are our priority.  As part of our continuing mission to provide you with exceptional heart care, we have created designated Provider Care Teams.  These Care Teams include your primary Cardiologist (physician) and Advanced Practice Providers (APPs -  Physician Assistants and Nurse Practitioners) who all work together to provide you with the care you need, when you need it.  Your next appointment:   6 week(s)  The format for your next appointment:   In Person  Provider:   Berniece Salines, DO  Other Instructions
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
## 2019-09-28 ENCOUNTER — Ambulatory Visit (INDEPENDENT_AMBULATORY_CARE_PROVIDER_SITE_OTHER): Payer: Medicare PPO | Admitting: Student

## 2019-09-28 ENCOUNTER — Encounter: Payer: Self-pay | Admitting: Student

## 2019-09-28 ENCOUNTER — Other Ambulatory Visit: Payer: Self-pay

## 2019-09-28 VITALS — Wt 169.4 lb

## 2019-09-28 DIAGNOSIS — I455 Other specified heart block: Secondary | ICD-10-CM | POA: Diagnosis not present

## 2019-09-28 LAB — CUP PACEART INCLINIC DEVICE CHECK
Battery Remaining Longevity: 161 mo
Battery Voltage: 3.22 V
Brady Statistic AP VP Percent: 0.21 %
Brady Statistic AP VS Percent: 47.8 %
Brady Statistic AS VP Percent: 0.13 %
Brady Statistic AS VS Percent: 51.86 %
Brady Statistic RA Percent Paced: 48.08 %
Brady Statistic RV Percent Paced: 0.34 %
Date Time Interrogation Session: 20201223094452
Implantable Lead Implant Date: 20201209
Implantable Lead Implant Date: 20201209
Implantable Lead Location: 753859
Implantable Lead Location: 753860
Implantable Lead Model: 5076
Implantable Lead Model: 5076
Implantable Pulse Generator Implant Date: 20201209
Lead Channel Impedance Value: 285 Ohm
Lead Channel Impedance Value: 399 Ohm
Lead Channel Impedance Value: 399 Ohm
Lead Channel Impedance Value: 513 Ohm
Lead Channel Pacing Threshold Amplitude: 0.625 V
Lead Channel Pacing Threshold Amplitude: 0.875 V
Lead Channel Pacing Threshold Pulse Width: 0.4 ms
Lead Channel Pacing Threshold Pulse Width: 0.4 ms
Lead Channel Sensing Intrinsic Amplitude: 0.5 mV
Lead Channel Sensing Intrinsic Amplitude: 0.5 mV
Lead Channel Sensing Intrinsic Amplitude: 15.375 mV
Lead Channel Sensing Intrinsic Amplitude: 17.75 mV
Lead Channel Setting Pacing Amplitude: 3.5 V
Lead Channel Setting Pacing Amplitude: 3.5 V
Lead Channel Setting Pacing Pulse Width: 0.4 ms
Lead Channel Setting Sensing Sensitivity: 2.8 mV

## 2019-09-28 NOTE — Progress Notes (Signed)
Wound check appointment. Steri-strips removed. Wound without redness or edema. Incision edges approximated, wound well healed. Normal device function. Thresholds, sensing, and impedances consistent with implant measurements. Device programmed at 3.5V/auto capture programmed on for extra safety margin until 3 month visit. Histogram distribution appropriate for patient and level of activity. No mode switches or high ventricular rates noted. Patient educated about wound care, arm mobility, lifting restrictions. ROV in 3 months with Dr. Curt Bears

## 2019-10-11 ENCOUNTER — Telehealth: Payer: Self-pay | Admitting: Cardiology

## 2019-10-11 NOTE — Telephone Encounter (Signed)
His left arm and leg are swollen but he's not in any pain

## 2019-10-11 NOTE — Telephone Encounter (Signed)
I spoke to the patient's daughter who called because the patient has developed swelling in his left arm and left leg with no pain. They will keep elevated and continue to monitor. He recently had bypass and pacemaker insertion.    At last visit with Dr Servando Salina, he was to be started on Carvedilol 3.125 mg bid, but the daughter said that nothing was ordered and would like f/u, please.  Please advise, thank you.

## 2019-10-13 NOTE — Telephone Encounter (Signed)
Left message to return call 

## 2019-10-17 NOTE — Telephone Encounter (Signed)
Disreagard previous statement . Charted on wrong encounter

## 2019-10-17 NOTE — Telephone Encounter (Signed)
Telephone call to patient. Appointment for tomorrow 10/17/2019 at 11:20 will get any labs then.

## 2019-11-04 ENCOUNTER — Encounter: Payer: Self-pay | Admitting: Cardiology

## 2019-11-04 ENCOUNTER — Other Ambulatory Visit: Payer: Self-pay

## 2019-11-04 ENCOUNTER — Ambulatory Visit (INDEPENDENT_AMBULATORY_CARE_PROVIDER_SITE_OTHER): Payer: No Typology Code available for payment source | Admitting: Cardiology

## 2019-11-04 VITALS — BP 152/62 | HR 67 | Ht 70.0 in | Wt 172.0 lb

## 2019-11-04 DIAGNOSIS — I2510 Atherosclerotic heart disease of native coronary artery without angina pectoris: Secondary | ICD-10-CM

## 2019-11-04 DIAGNOSIS — Z950 Presence of cardiac pacemaker: Secondary | ICD-10-CM | POA: Insufficient documentation

## 2019-11-04 DIAGNOSIS — I480 Paroxysmal atrial fibrillation: Secondary | ICD-10-CM

## 2019-11-04 DIAGNOSIS — E782 Mixed hyperlipidemia: Secondary | ICD-10-CM

## 2019-11-04 DIAGNOSIS — I10 Essential (primary) hypertension: Secondary | ICD-10-CM

## 2019-11-04 HISTORY — DX: Essential (primary) hypertension: I10

## 2019-11-04 HISTORY — DX: Paroxysmal atrial fibrillation: I48.0

## 2019-11-04 HISTORY — DX: Mixed hyperlipidemia: E78.2

## 2019-11-04 HISTORY — DX: Presence of cardiac pacemaker: Z95.0

## 2019-11-04 MED ORDER — CARVEDILOL 3.125 MG PO TABS: 3.1250 mg | ORAL_TABLET | Freq: Two times a day (BID) | ORAL | 1 refills | Status: DC

## 2019-11-04 MED ORDER — CARVEDILOL 3.125 MG PO TABS: 3.1250 mg | ORAL_TABLET | Freq: Two times a day (BID) | ORAL | 1 refills | Status: AC

## 2019-11-04 NOTE — Patient Instructions (Signed)
Medication Instructions:  Your physician has recommended you make the following change in your medication:   START: Coreg (carvedilol) 3.125 mg Take 1 tab twice daily  *If you need a refill on your cardiac medications before your next appointment, please call your pharmacy*  Lab Work: None If you have labs (blood work) drawn today and your tests are completely normal, you will receive your results only by: Marland Kitchen MyChart Message (if you have MyChart) OR . A paper copy in the mail If you have any lab test that is abnormal or we need to change your treatment, we will call you to review the results.  Testing/Procedures: None  Follow-Up: At [facility], you and your health needs are our priority.  As part of our continuing mission to provide you with exceptional heart care, we have created designated Provider Care Teams.  These Care Teams include your primary Cardiologist (physician) and Advanced Practice Providers (APPs -  Physician Assistants and Nurse Practitioners) who all work together to provide you with the care you need, when you need it.  Your next appointment:   3 month(s)  The format for your next appointment:   In Person  Provider:   Thomasene Ripple, DO  Other Instructions You are being Referred to Cardiac Rehab at [facility]. You will be contacted with date and times of appointments.
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
## 2019-12-13 ENCOUNTER — Telehealth: Payer: Self-pay | Admitting: Cardiology

## 2019-12-13 ENCOUNTER — Encounter: Payer: No Typology Code available for payment source | Admitting: Cardiology

## 2019-12-13 NOTE — Telephone Encounter (Signed)
I spoke to the patient's daughter and she will need to accompany the patient to his appointment next week with Mardelle Matte, due to memory issues.

## 2019-12-13 NOTE — Telephone Encounter (Signed)
Patient's daughter, Massie Bougie, states she is requesting to accompany the patient during his [facility] appointment scheduled for 12/20/19 at 8:25 AM with Maxine Glenn due to the patient having issues with understanding. Please advise.

## 2019-12-13 NOTE — Progress Notes (Deleted)
Electrophysiology Office Note   Date:  12/13/2019   ID:  Paul Torres, DOB 1945/12/17, MRN [id number]  PCP:  Sydnee Cabal, MD  Cardiologist:  [place] Primary Electrophysiologist:  Anjuli Gemmill Meredith Leeds, MD    Chief Complaint: pacemaker   History of Present Illness: Paul Torres is a 74 y.o. male who is being seen today for the evaluation of pacemaker at the request of Sydnee Cabal, MD. Presenting today for electrophysiology evaluation.  He has a history significant for type 2 diabetes, hypertension, hyperlipidemia, prior tobacco abuse, and coronary artery disease status post CABG 03/04/2019.  He presented to the hospital December 2020 with syncope and episodic pauses up to 15 seconds due to sinus arrest.  He is now status post Medtronic dual-chamber pacemaker implanted 09/14/2019.  Today, he denies*** symptoms of palpitations, chest pain, shortness of breath, orthopnea, PND, lower extremity edema, claudication, dizziness, presyncope, syncope, bleeding, or neurologic sequela. The patient is tolerating medications without difficulties.    Past Medical History:  Diagnosis Date  . Diabetes mellitus without complication ([place])   . Gout   . Hyperlipemia 08/03/2019  . Hypertension   . Syncope    Past Surgical History:  Procedure Laterality Date  . CORONARY ARTERY BYPASS GRAFT N/A 08/04/2019   Procedure: CORONARY ARTERY BYPASS GRAFTING (CABG) x 5 WITH ENDOSCOPIC HARVESTING OF RIGHT GREATER SAPHENOUS VEIN. LIMA TO LAD;  Surgeon: Gaye Pollack, MD;  Location: [facility] OR;  Service: Open Heart Surgery;  Laterality: N/A;  . LEFT HEART CATH AND CORONARY ANGIOGRAPHY N/A 08/03/2019   Procedure: LEFT HEART CATH AND CORONARY ANGIOGRAPHY;  Surgeon: Belva Crome, MD;  Location: [place] [facility];  Service: Cardiovascular;  Laterality: N/A;  . PACEMAKER IMPLANT N/A 09/14/2019   Procedure: PACEMAKER IMPLANT;  Surgeon: Constance Haw, MD;  Location: [place] [facility];  Service: Cardiovascular;   Laterality: N/A;  . TEE WITHOUT CARDIOVERSION N/A 08/04/2019   Procedure: TRANSESOPHAGEAL ECHOCARDIOGRAM (TEE);  Surgeon: Gaye Pollack, MD;  Location: [place];  Service: Open Heart Surgery;  Laterality: N/A;  . TEMPORARY PACEMAKER N/A 08/03/2019   Procedure: TEMPORARY PACEMAKER;  Surgeon: Belva Crome, MD;  Location: [facility];  Service: Cardiovascular;  Laterality: N/A;     Current Outpatient Medications  Medication Sig Dispense Refill  . acetaminophen (TYLENOL) 325 MG tablet Take 2 tablets (650 mg total) by mouth every 6 (six) hours as needed for mild pain.    Marland Kitchen amiodarone (PACERONE) 200 MG tablet Take 1 tablet (200 mg total) by mouth daily. 30 tablet 3  . apixaban (ELIQUIS) 5 MG TABS tablet Take 1 tablet (5 mg total) by mouth 2 (two) times daily. 60 tablet 2  . aspirin EC 81 MG tablet Take 81 mg by mouth daily as needed.    . Carboxymethylcellulose Sod PF 0.25 % SOLN Place 1 drop into both eyes 4 (four) times daily.    . Carboxymethylcellulose Sod PF 1 % GEL Place 1 application into both eyes as needed.     . carvedilol (COREG) 3.125 MG tablet Take 1 tablet (3.125 mg total) by mouth 2 (two) times daily. 180 tablet 1  . Cholecalciferol (VITAMIN D3) 250 MCG (10000 UT) TABS Take 1 tablet by mouth daily.    . dorzolamidel-timolol (COSOPT) 22.3-6.8 MG/ML SOLN ophthalmic solution Place 1 drop into both eyes 2 (two) times daily.    . fluticasone (FLONASE) 50 MCG/ACT nasal spray Place 2 sprays into both nostrils daily as needed for allergies.     Marland Kitchen  glipiZIDE (GLUCOTROL) 10 MG tablet Take 10 mg by mouth as needed.     . insulin aspart (NOVOLOG FLEXPEN) 100 UNIT/ML FlexPen Inject 5 Units into the skin as needed.     Marland Kitchen ketotifen (ZADITOR) 0.025 % ophthalmic solution Place 1 drop into both eyes 2 (two) times daily.    . metFORMIN (GLUCOPHAGE) 1000 MG tablet Take 1,000 mg by mouth 2 (two) times daily with a meal.    . Omega-3 Fatty Acids (FISH OIL) 1000 MG CAPS Take 1,000 mg by mouth 2  (two) times daily.     Marland Kitchen omeprazole (PRILOSEC) 20 MG capsule Take 20 mg by mouth daily.    . pravastatin (PRAVACHOL) 10 MG tablet Take 10 mg by mouth at bedtime.    . tamsulosin (FLOMAX) 0.4 MG CAPS capsule Take 0.4 mg by mouth at bedtime.    . traMADol (ULTRAM) 50 MG tablet Take 50 mg by mouth as needed for moderate pain.     . traZODone (DESYREL) 50 MG tablet Take 50 mg by mouth at bedtime as needed for sleep.     . vitamin B-12 (CYANOCOBALAMIN) 500 MCG tablet Take 500 mcg by mouth daily.     No current facility-administered medications for this visit.    Allergies:   Patient has no known allergies.   Social History:  The patient  reports that he has quit smoking. He has never used smokeless tobacco. He reports previous alcohol use.   Family History:  The patient's family history includes Alzheimer's disease in his mother; CAD in his father; Hepatitis C in his brother.    ROS:  Please see the history of present illness.   Otherwise, review of systems is positive for ***.   All other systems are reviewed and negative.    PHYSICAL EXAM: VS:  There were no vitals taken for this visit. , BMI There is no height or weight on file to calculate BMI. GEN: Well nourished, well developed, in no acute distress  HEENT: normal  Neck: no JVD, carotid bruits, or masses Cardiac: ***RRR; no murmurs, rubs, or gallops,no edema  Respiratory:  clear to auscultation bilaterally, normal work of breathing GI: soft, nontender, nondistended, + BS MS: no deformity or atrophy  Skin: warm and dry, device pocket is well healed Neuro:  Strength and sensation are intact Psych: euthymic mood, full affect  EKG:  EKG {ACTION; IS/IS [id number]} ordered today. Personal review of the ekg ordered *** shows ***  Device interrogation is reviewed today in detail.  See PaceArt for details.   Recent Labs: 09/14/2019: ALT 11; BUN 12; Creatinine, Ser 1.86; Hemoglobin 11.5; Magnesium 1.4; Platelets 232; Potassium 3.2;  Sodium 138    Lipid Panel     Component Value Date/Time   CHOL 72 08/04/2019 0700   TRIG 69 08/04/2019 0700   HDL 26 (L) 08/04/2019 0700   CHOLHDL 2.8 08/04/2019 0700   VLDL 14 08/04/2019 0700   LDLCALC 32 08/04/2019 0700     Wt Readings from Last 3 Encounters:  11/04/19 172 lb (78 kg)  09/28/19 169 lb 6.4 oz (76.8 kg)  09/22/19 168 lb (76.2 kg)      Other studies Reviewed: Additional studies/ records that were reviewed today include: LHC 08/03/19  Review of the above records today demonstrates:   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  Total occlusion of the right coronary  filling late by collaterals from the left coronary.  Widely patent left main.  Unable to determine if the left main courses between the pulmonary artery and aorta.    95 to 99% proximal LAD stenosis.  Mid to distal LAD is diffusely diseased up to 80% and may be intramyocardial.  A large ramus intermedius contains proximal 70 to 80% stenosis.  Circumflex distribution is supplied by a vessel that has segmental 90% calcified stenosis before branching onto the lateral wall.  The distal circumflex territory is not well visualized and appears to be from a retroaortic vessel arising from the right coronary.  No significant branches are noted on the left inferolateral wall.  LVEDP was normal.  Echocardiogram done earlier today suggests inferior wall akinesis.  EF 60%.   Successful placement of temporary transvenous pacemaker from right femoral vein.  High threshold.  TEE 08/04/2019 Left Ventricle: The left ventricle has moderately reduced systolic  function, with an ejection fraction of 35-40%. The cavity size was mildly  dilated. There is no increase in left ventricular wall thickness. Left  ventrical global hypokinesis without  regional wall motion abnormalities.    ASSESSMENT AND PLAN:  1.  Sinus arrest: Currently on amiodarone for management of atrial fibrillation.  Status post Medtronic dual-chamber pacemaker implanted 09/14/2019.  Device functioning appropriately.  No changes.***  2.  Paroxysmal atrial fibrillation: Currently on Eliquis and amiodarone.  CHA2DS2-VASc of 5.***  3.  Coronary artery disease: Status post CABG.***  4.  Hypertension:***    Current medicines are reviewed at length with the patient today.   The patient {ACTIONS; HAS/DOES NOT HAVE:19233} concerns regarding his medicines.  The following changes were made today:  {NONE DEFAULTED:18576::"none"}  Labs/ tests ordered today include: *** No orders of the defined types were placed in this encounter.    Disposition:   FU with Yulisa Chirico {gen number [id number]} {Days to years:10300}  Signed, Kailynne Ferrington Jorja Loa, MD  12/13/2019 9:28 AM     [facility] [address] [place] [address] [phone] (office) [phone] (fax)

## 2019-12-15 ENCOUNTER — Ambulatory Visit (INDEPENDENT_AMBULATORY_CARE_PROVIDER_SITE_OTHER): Payer: No Typology Code available for payment source | Admitting: *Deleted

## 2019-12-15 DIAGNOSIS — Z950 Presence of cardiac pacemaker: Secondary | ICD-10-CM | POA: Diagnosis not present

## 2019-12-15 LAB — CUP PACEART REMOTE DEVICE CHECK
Battery Remaining Longevity: 135 mo
Battery Voltage: 3.19 V
Brady Statistic AP VP Percent: 0.2 %
Brady Statistic AP VS Percent: 65.09 %
Brady Statistic AS VP Percent: 0.05 %
Brady Statistic AS VS Percent: 34.66 %
Brady Statistic RA Percent Paced: 65.41 %
Brady Statistic RV Percent Paced: 0.25 %
Date Time Interrogation Session: 20210311035328
Implantable Lead Implant Date: 20201209
Implantable Lead Implant Date: 20201209
Implantable Lead Location: 753859
Implantable Lead Location: 753860
Implantable Lead Model: 5076
Implantable Lead Model: 5076
Implantable Pulse Generator Implant Date: 20201209
Lead Channel Impedance Value: 285 Ohm
Lead Channel Impedance Value: 361 Ohm
Lead Channel Impedance Value: 399 Ohm
Lead Channel Impedance Value: 475 Ohm
Lead Channel Pacing Threshold Amplitude: 0.625 V
Lead Channel Pacing Threshold Amplitude: 0.875 V
Lead Channel Pacing Threshold Pulse Width: 0.4 ms
Lead Channel Pacing Threshold Pulse Width: 0.4 ms
Lead Channel Sensing Intrinsic Amplitude: 0.75 mV
Lead Channel Sensing Intrinsic Amplitude: 0.75 mV
Lead Channel Sensing Intrinsic Amplitude: 20.25 mV
Lead Channel Sensing Intrinsic Amplitude: 20.25 mV
Lead Channel Setting Pacing Amplitude: 3.25 V
Lead Channel Setting Pacing Amplitude: 3.25 V
Lead Channel Setting Pacing Pulse Width: 0.4 ms
Lead Channel Setting Sensing Sensitivity: 2.8 mV

## 2019-12-15 NOTE — Progress Notes (Signed)
PPM Remote  

## 2019-12-19 NOTE — Progress Notes (Signed)
Electrophysiology Office Note Date: 12/20/2019  ID:  Paul Torres, DOB October 21, 1945, MRN [id number]  PCP: [name], MD Primary Cardiologist: Thomasene Ripple, DO Electrophysiologist: Will Jorja Loa, MD   CC: Pacemaker follow-up  Paul Torres is a 74 y.o. male seen today for Dr. Elberta Fortis . he presents today for routine electrophysiology followup.  Since last being seen in our clinic, the patient reports doing very well.  he denies chest pain, palpitations, dyspnea, PND, orthopnea, nausea, vomiting, dizziness, syncope, edema, weight gain, or early satiety.  He thought he would feel better after having a pacemaker placed. His main complaint is general fatigue that is not worse, but hasn't improved much since PPM placement. Otherwise he is doing well overall. He took his coreg right before coming in. His daughter reports BPs not getting any higher than 130 systolic at home.  Device History: Medtronic Dual Chamber PPM implanted 09/2019 for symptomatic bradycardia  Past Medical History:  Diagnosis Date  . Diabetes mellitus without complication ([facility])   . Gout   . Hyperlipemia 08/03/2019  . Hypertension   . Syncope    Past Surgical History:  Procedure Laterality Date  . CORONARY ARTERY BYPASS GRAFT N/A 08/04/2019   Procedure: CORONARY ARTERY BYPASS GRAFTING (CABG) x 5 WITH ENDOSCOPIC HARVESTING OF RIGHT GREATER SAPHENOUS VEIN. LIMA TO LAD;  Surgeon: Alleen Borne, MD;  Location: [facility] OR;  Service: Open Heart Surgery;  Laterality: N/A;  . LEFT HEART CATH AND CORONARY ANGIOGRAPHY N/A 08/03/2019   Procedure: LEFT HEART CATH AND CORONARY ANGIOGRAPHY;  Surgeon: Lyn Records, MD;  Location: [facility] INVAS[facility] LAB;  Service: Cardiovascular;  Laterality: N/A;  . PACEMAKER IMPLANT N/A 09/14/2019   Procedure: PACEMAKER IMPLANT;  Surgeon: Regan Lemming, MD;  Location: [facility] IN[facility];  Service: Cardiovascular;  Laterality: N/A;  . TEE WITHOUT CARDIOVERSION N/A 08/04/2019   Procedure:  TRANSESOPHAGEAL ECHOCARDIOGRAM (TEE);  Surgeon: Alleen Borne, MD;  Location[facility];  Service: Open Heart Surgery;  Laterality: N/A;  . TEMPORARY PACEMAKER N/A 08/03/2019   Procedure: TEMPORARY PACEMAKER;  Surgeon: Lyn Records, MD;  Location: [facility] INVASIVE [facility] LAB;  Service: Cardiovascular;  Laterality: N/A;    Current Outpatient Medications  Medication Sig Dispense Refill  . acetaminophen (TYLENOL) 325 MG tablet Take 2 tablets (650 mg total) by mouth every 6 (six) hours as needed for mild pain.    Marland Kitchen amiodarone (PACERONE) 200 MG tablet Take 1 tablet (200 mg total) by mouth daily. 30 tablet 3  . apixaban (ELIQUIS) 5 MG TABS tablet Take 1 tablet (5 mg total) by mouth 2 (two) times daily. 60 tablet 2  . aspirin EC 81 MG tablet Take 81 mg by mouth daily as needed.    . Carboxymethylcellulose Sod PF 0.25 % SOLN Place 1 drop into both eyes 4 (four) times daily.    . Carboxymethylcellulose Sod PF 1 % GEL Place 1 application into both eyes as needed.     . carvedilol (COREG) 3.125 MG tablet Take 1 tablet (3.125 mg total) by mouth 2 (two) times daily. 180 tablet 1  . Cholecalciferol (VITAMIN D3) 250 MCG (10000 UT) TABS Take 1 tablet by mouth daily.    . dorzolamidel-timolol (COSOPT) 22.3-6.8 MG/ML SOLN ophthalmic solution Place 1 drop into both eyes 2 (two) times daily.    . fluticasone (FLONASE) 50 MCG/ACT nasal spray Place 2 sprays into both nostrils daily as needed for allergies.     Marland Kitchen glipiZIDE (GLUCOTROL) 10 MG tablet Take 10 mg  by mouth as needed.     . insulin aspart (NOVOLOG FLEXPEN) 100 UNIT/ML FlexPen Inject 5 Units into the skin as needed.     Marland Kitchen ketotifen (ZADITOR) 0.025 % ophthalmic solution Place 1 drop into both eyes 2 (two) times daily.    . metFORMIN (GLUCOPHAGE) 1000 MG tablet Take 1,000 mg by mouth 2 (two) times daily with a meal.    . Omega-3 Fatty Acids (FISH OIL) 1000 MG CAPS Take 1,000 mg by mouth 2 (two) times daily.     Marland Kitchen omeprazole (PRILOSEC) 20 MG capsule Take 20 mg by mouth  daily.    . pravastatin (PRAVACHOL) 10 MG tablet Take 10 mg by mouth at bedtime.    . tamsulosin (FLOMAX) 0.4 MG CAPS capsule Take 0.4 mg by mouth at bedtime.    . traMADol (ULTRAM) 50 MG tablet Take 50 mg by mouth as needed for moderate pain.     . traZODone (DESYREL) 50 MG tablet Take 50 mg by mouth at bedtime as needed for sleep.     . vitamin B-12 (CYANOCOBALAMIN) 500 MCG tablet Take 500 mcg by mouth daily.     No current facility-administered medications for this visit.    Allergies:   Patient has no known allergies.   Social History: Social History   Socioeconomic History  . Marital status: Married    Spouse name: Not on file  . Number of children: Not on file  . Years of education: Not on file  . Highest education level: Not on file  Occupational History  . Not on file  Tobacco Use  . Smoking status: Former Games developer  . Smokeless tobacco: Never Used  Substance and Sexual Activity  . Alcohol use: Not Currently  . Drug use: Not on file  . Sexual activity: Not on file  Other Topics Concern  . Not on file  Social History Narrative  . Not on file   Social Determinants of Health   Financial Resource Strain:   . Difficulty of Paying Living Expenses:   Food Insecurity:   . Worried About Programme researcher, broadcasting/film/video in the Last Year:   . Barista in the Last Year:   Transportation Needs:   . Freight forwarder (Medical):   Marland Kitchen Lack of Transportation (Non-Medical):   Physical Activity:   . Days of Exercise per Week:   . Minutes of Exercise per Session:   Stress:   . Feeling of Stress :   Social Connections:   . Frequency of Communication with Friends and Family:   . Frequency of Social Gatherings with Friends and Family:   . Attends Religious Services:   . Active Member of Clubs or Organizations:   . Attends Banker Meetings:   Marland Kitchen Marital Status:   Intimate Partner Violence:   . Fear of Current or Ex-Partner:   . Emotionally Abused:   Marland Kitchen Physically  Abused:   . Sexually Abused:     Family History: Family History  Problem Relation Age of Onset  . Alzheimer's disease Mother   . CAD Father        CABG  . Hepatitis C Brother      Review of Systems: All other systems reviewed and are otherwise negative except as noted above.  Physical Exam: Vitals:   12/20/19 0834  BP: (!) 150/66  Pulse: 66  SpO2: 99%  Weight: 167 lb (75.8 kg)  Height: 5\' 10"  (1.778 m)     GEN- The patient  is well appearing, alert and oriented x 3 today.   HEENT: normocephalic, atraumatic; sclera clear, conjunctiva pink; hearing intact; oropharynx clear; neck supple  Lungs- Clear to ausculation bilaterally, normal work of breathing.  No wheezes, rales, rhonchi Heart- Regular rate and rhythm, no murmurs, rubs or gallops  GI- soft, non-tender, non-distended, bowel sounds present  Extremities- no clubbing, cyanosis, or edema  MS- no significant deformity or atrophy Skin- warm and dry, no rash or lesion; PPM pocket well healed Psych- euthymic mood, full affect Neuro- strength and sensation are intact  PPM Interrogation- reviewed in detail today,  See PACEART report   EKG:  EKG is ordered today. Personal review shows A paced V Sensed at 66 bpm. Incomplete LBBB   Recent Labs: 09/14/2019: ALT 11; BUN 12; Creatinine, Ser 1.86; Hemoglobin 11.5; Magnesium 1.4; Platelets 232; Potassium 3.2; Sodium 138   Wt Readings from Last 3 Encounters:  12/20/19 167 lb (75.8 kg)  11/04/19 172 lb (78 kg)  09/28/19 169 lb 6.4 oz (76.8 kg)     Other studies Reviewed: Additional studies/ records that were reviewed today include:  Previous EP notes, Previous remote checks, Most recent labwork.   Assessment and Plan:  1. Symptomatic bradycardia s/p Medtronic PPM  Normal PPM function See Pace Art report No changes today  2. CAD Denies anginal symptoms Continue current meds  3. Paroxysmal AF Continue Eliquis for CHA2DS2VASC of at least 5    4. HTN Continue current  medications. Considered increasing coreg today to 6.25 mg BID, but they would prefer to talk to Dr. Harriet Masson about it.  Will draw BMET/CBC today.  Current medicines are reviewed at length with the patient today.   The patient does not have concerns regarding his medicines.  The following changes were made today:  none  Labs/ tests ordered today include:  Orders Placed This Encounter  Procedures  . CBC  . Basic Metabolic Panel (BMET)  . CUP PACEART [place]  . EKG 12-Lead    Disposition:   Follow up with Dr. Curt Bears in 12 months. Continue remotes q 3 months.   Jacalyn Lefevre, PA-C  12/20/2019 8:57 AM  [facility] [address] [place] [address] [phone] (office) [phone] (fax)

## 2019-12-20 ENCOUNTER — Other Ambulatory Visit: Payer: Self-pay

## 2019-12-20 ENCOUNTER — Ambulatory Visit (INDEPENDENT_AMBULATORY_CARE_PROVIDER_SITE_OTHER): Payer: No Typology Code available for payment source | Admitting: Student

## 2019-12-20 VITALS — BP 150/66 | HR 66 | Ht 70.0 in | Wt 167.0 lb

## 2019-12-20 DIAGNOSIS — R55 Syncope and collapse: Secondary | ICD-10-CM

## 2019-12-20 DIAGNOSIS — Z79899 Other long term (current) drug therapy: Secondary | ICD-10-CM

## 2019-12-20 DIAGNOSIS — Z950 Presence of cardiac pacemaker: Secondary | ICD-10-CM

## 2019-12-20 DIAGNOSIS — I455 Other specified heart block: Secondary | ICD-10-CM

## 2019-12-20 DIAGNOSIS — I480 Paroxysmal atrial fibrillation: Secondary | ICD-10-CM

## 2019-12-20 DIAGNOSIS — I10 Essential (primary) hypertension: Secondary | ICD-10-CM

## 2019-12-20 DIAGNOSIS — I2510 Atherosclerotic heart disease of native coronary artery without angina pectoris: Secondary | ICD-10-CM

## 2019-12-20 LAB — CUP PACEART INCLINIC DEVICE CHECK
Battery Remaining Longevity: 145 mo
Battery Voltage: 3.19 V
Brady Statistic AP VP Percent: 0.19 %
Brady Statistic AP VS Percent: 67.04 %
Brady Statistic AS VP Percent: 0.05 %
Brady Statistic AS VS Percent: 32.72 %
Brady Statistic RA Percent Paced: 67.35 %
Brady Statistic RV Percent Paced: 0.24 %
Date Time Interrogation Session: 20210316085335
Implantable Lead Implant Date: 20201209
Implantable Lead Implant Date: 20201209
Implantable Lead Location: 753859
Implantable Lead Location: 753860
Implantable Lead Model: 5076
Implantable Lead Model: 5076
Implantable Pulse Generator Implant Date: 20201209
Lead Channel Impedance Value: 304 Ohm
Lead Channel Impedance Value: 399 Ohm
Lead Channel Impedance Value: 456 Ohm
Lead Channel Impedance Value: 513 Ohm
Lead Channel Pacing Threshold Amplitude: 0.625 V
Lead Channel Pacing Threshold Amplitude: 0.875 V
Lead Channel Pacing Threshold Pulse Width: 0.4 ms
Lead Channel Pacing Threshold Pulse Width: 0.4 ms
Lead Channel Sensing Intrinsic Amplitude: 0.625 mV
Lead Channel Sensing Intrinsic Amplitude: 0.75 mV
Lead Channel Sensing Intrinsic Amplitude: 22.5 mV
Lead Channel Sensing Intrinsic Amplitude: 25.625 mV
Lead Channel Setting Pacing Amplitude: 2.5 V
Lead Channel Setting Pacing Amplitude: 2.75 V
Lead Channel Setting Pacing Pulse Width: 0.4 ms
Lead Channel Setting Sensing Sensitivity: 2.8 mV

## 2019-12-20 LAB — BASIC METABOLIC PANEL
BUN/Creatinine Ratio: 7 — ABNORMAL LOW (ref 10–24)
BUN: 14 mg/dL (ref 8–27)
CO2: 25 mmol/L (ref 20–29)
Calcium: 8.7 mg/dL (ref 8.6–10.2)
Chloride: 103 mmol/L (ref 96–106)
Creatinine, Ser: 1.99 mg/dL — ABNORMAL HIGH (ref 0.76–1.27)
GFR calc Af Amer: 37 mL/min/{1.73_m2} — ABNORMAL LOW (ref >59)
GFR calc non Af Amer: 32 mL/min/{1.73_m2} — ABNORMAL LOW (ref >59)
Glucose: 104 mg/dL — ABNORMAL HIGH (ref 65–99)
Potassium: 4.2 mmol/L (ref 3.5–5.2)
Sodium: 141 mmol/L (ref 134–144)

## 2019-12-20 LAB — CBC
Hematocrit: 34.8 % — ABNORMAL LOW (ref 37.5–51.0)
Hemoglobin: 11.8 g/dL — ABNORMAL LOW (ref 13.0–17.7)
MCH: 29.1 pg (ref 26.6–33.0)
MCHC: 33.9 g/dL (ref 31.5–35.7)
MCV: 86 fL (ref 79–97)
Platelets: 204 10*3/uL (ref 150–450)
RBC: 4.05 x10E6/uL — ABNORMAL LOW (ref 4.14–5.80)
RDW: 13.4 % (ref 11.6–15.4)
WBC: 8.1 10*3/uL (ref 3.4–10.8)

## 2019-12-20 NOTE — Patient Instructions (Addendum)
Medication Instructions:  none *If you need a refill on your cardiac medications before your next appointment, please call your pharmacy*   Lab Work:  TODAY CBC BMET If you have labs (blood work) drawn today and your tests are completely normal, you will receive your results only by: Marland Kitchen MyChart Message (if you have MyChart) OR . A paper copy in the mail If you have any lab test that is abnormal or we need to change your treatment, we will call you to review the results.   Testing/Procedures: none   Follow-Up: At [facility], you and your health needs are our priority.  As part of our continuing mission to provide you with exceptional heart care, we have created designated Provider Care Teams.  These Care Teams include your primary Cardiologist (physician) and Advanced Practice Providers (APPs -  Physician Assistants and Nurse Practitioners) who all work together to provide you with the care you need, when you need it.  We recommend signing up for the patient portal called "MyChart".  Sign up information is provided on this After Visit Summary.  MyChart is used to connect with patients for Virtual Visits (Telemedicine).  Patients are able to view lab/test results, encounter notes, upcoming appointments, etc.  Non-urgent messages can be sent to your provider as well.   To learn more about what you can do with MyChart, go to [email or web address].    Your next appointment:   1 year(s)  The format for your next appointment:   Either In Person or Virtual  Provider:   Dr Elberta Fortis   Other Instructions Remote monitoring is used to monitor your Pacemaker  from home. This monitoring reduces the number of office visits required to check your device to one time per year. It allows [place] to keep an eye on the functioning of your device to ensure it is working properly. You are scheduled for a device check from home on 03/15/20. You may send your transmission at any time that day. If you have a  wireless device, the transmission will be sent automatically. After your physician reviews your transmission, you will receive a postcard with your next transmission date.

## 2019-12-21 ENCOUNTER — Encounter (INDEPENDENT_AMBULATORY_CARE_PROVIDER_SITE_OTHER): Payer: Self-pay

## 2019-12-21 ENCOUNTER — Encounter: Payer: Self-pay | Admitting: Cardiology

## 2019-12-21 ENCOUNTER — Ambulatory Visit (INDEPENDENT_AMBULATORY_CARE_PROVIDER_SITE_OTHER): Payer: No Typology Code available for payment source | Admitting: Cardiology

## 2019-12-21 VITALS — BP 132/74 | HR 90 | Temp 97.3°F | Ht 70.0 in | Wt 169.4 lb

## 2019-12-21 DIAGNOSIS — I480 Paroxysmal atrial fibrillation: Secondary | ICD-10-CM | POA: Diagnosis not present

## 2019-12-21 DIAGNOSIS — E782 Mixed hyperlipidemia: Secondary | ICD-10-CM

## 2019-12-21 DIAGNOSIS — Z951 Presence of aortocoronary bypass graft: Secondary | ICD-10-CM | POA: Diagnosis not present

## 2019-12-21 DIAGNOSIS — I255 Ischemic cardiomyopathy: Secondary | ICD-10-CM | POA: Diagnosis not present

## 2019-12-21 DIAGNOSIS — I2510 Atherosclerotic heart disease of native coronary artery without angina pectoris: Secondary | ICD-10-CM | POA: Diagnosis not present

## 2019-12-21 DIAGNOSIS — Z950 Presence of cardiac pacemaker: Secondary | ICD-10-CM
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
## 2019-12-21 NOTE — Patient Instructions (Signed)

## 2019-12-29 ENCOUNTER — Telehealth: Payer: Self-pay | Admitting: Cardiology

## 2019-12-29 NOTE — Telephone Encounter (Signed)
   Kim from [place] would like to get copy of the notes from pt last visit on 12/20/19. She provided fax [phone]  Please advise

## 2020-01-17 ENCOUNTER — Telehealth: Payer: Self-pay | Admitting: Cardiology

## 2020-01-17 ENCOUNTER — Other Ambulatory Visit: Payer: Self-pay | Admitting: Pharmacist Clinician (PhC)/ Clinical Pharmacy Specialist

## 2020-01-17 MED ORDER — APIXABAN 5 MG PO TABS: 5.0000 mg | ORAL_TABLET | Freq: Two times a day (BID) | ORAL | 1 refills | Status: DC

## 2020-01-17 NOTE — Addendum Note (Signed)
Addended by: Rosalee Kaufman on: 01/17/2020 01:42 PM   Modules accepted: Orders

## 2020-01-17 NOTE — Telephone Encounter (Signed)
Paul Torres with [facility] called regarding RX for Eliquis prescription please call [phone]

## 2020-01-17 NOTE — Telephone Encounter (Signed)
Call eliquis to [facility]

## 2020-01-17 NOTE — Telephone Encounter (Signed)
Called back. Paul Torres didn't answer for the extension provided no option to leave voicemail.

## 2020-01-23 ENCOUNTER — Other Ambulatory Visit: Payer: Self-pay | Admitting: Physician Assistant

## 2020-01-24 ENCOUNTER — Other Ambulatory Visit (HOSPITAL_COMMUNITY): Payer: Self-pay | Admitting: *Deleted

## 2020-01-24 ENCOUNTER — Other Ambulatory Visit: Payer: Self-pay

## 2020-01-24 MED ORDER — AMIODARONE HCL 200 MG PO TABS: 200.0000 mg | ORAL_TABLET | Freq: Every day | ORAL | 3 refills | Status: DC

## 2020-01-24 MED ORDER — APIXABAN 5 MG PO TABS: 5.0000 mg | ORAL_TABLET | Freq: Two times a day (BID) | ORAL | 3 refills | Status: DC

## 2020-01-24 NOTE — Telephone Encounter (Signed)
This is Dr. Tobb's pt 

## 2020-01-27 ENCOUNTER — Ambulatory Visit: Payer: Medicare PPO | Admitting: Cardiology

## 2020-01-30 ENCOUNTER — Other Ambulatory Visit: Payer: No Typology Code available for payment source

## 2020-03-15 ENCOUNTER — Ambulatory Visit (INDEPENDENT_AMBULATORY_CARE_PROVIDER_SITE_OTHER): Payer: No Typology Code available for payment source | Admitting: *Deleted

## 2020-03-15 DIAGNOSIS — I255 Ischemic cardiomyopathy: Secondary | ICD-10-CM | POA: Diagnosis not present

## 2020-03-15 LAB — CUP PACEART REMOTE DEVICE CHECK
Battery Remaining Longevity: 159 mo
Battery Voltage: 3.17 V
Brady Statistic AP VP Percent: 0.12 %
Brady Statistic AP VS Percent: 98.19 %
Brady Statistic AS VP Percent: 0 %
Brady Statistic AS VS Percent: 1.69 %
Brady Statistic RA Percent Paced: 98.31 %
Brady Statistic RV Percent Paced: 0.13 %
Date Time Interrogation Session: 20210609191323
Implantable Lead Implant Date: 20201209
Implantable Lead Implant Date: 20201209
Implantable Lead Location: 753859
Implantable Lead Location: 753860
Implantable Lead Model: 5076
Implantable Lead Model: 5076
Implantable Pulse Generator Implant Date: 20201209
Lead Channel Impedance Value: 285 Ohm
Lead Channel Impedance Value: 361 Ohm
Lead Channel Impedance Value: 437 Ohm
Lead Channel Impedance Value: 475 Ohm
Lead Channel Pacing Threshold Amplitude: 0.75 V
Lead Channel Pacing Threshold Amplitude: 0.75 V
Lead Channel Pacing Threshold Pulse Width: 0.4 ms
Lead Channel Pacing Threshold Pulse Width: 0.4 ms
Lead Channel Sensing Intrinsic Amplitude: 0.875 mV
Lead Channel Sensing Intrinsic Amplitude: 0.875 mV
Lead Channel Sensing Intrinsic Amplitude: 20 mV
Lead Channel Sensing Intrinsic Amplitude: 20 mV
Lead Channel Setting Pacing Amplitude: 1.75 V
Lead Channel Setting Pacing Amplitude: 2.5 V
Lead Channel Setting Pacing Pulse Width: 0.4 ms
Lead Channel Setting Sensing Sensitivity: 2.8 mV

## 2020-03-16 NOTE — Progress Notes (Signed)
Remote pacemaker transmission.   

## 2020-05-25 ENCOUNTER — Telehealth: Payer: Self-pay

## 2020-05-25 NOTE — Telephone Encounter (Signed)
LMOVM for pt to return my call. I needed to see if he wants to be released in [facility] to [facility]

## 2020-06-14 ENCOUNTER — Ambulatory Visit (INDEPENDENT_AMBULATORY_CARE_PROVIDER_SITE_OTHER): Payer: No Typology Code available for payment source | Admitting: *Deleted

## 2020-06-14 DIAGNOSIS — I455 Other specified heart block: Secondary | ICD-10-CM | POA: Diagnosis not present

## 2020-06-14 LAB — CUP PACEART REMOTE DEVICE CHECK
Battery Remaining Longevity: 155 mo
Battery Voltage: 3.12 V
Brady Statistic AP VP Percent: 0.13 %
Brady Statistic AP VS Percent: 93.04 %
Brady Statistic AS VP Percent: 0.01 %
Brady Statistic AS VS Percent: 6.83 %
Brady Statistic RA Percent Paced: 93.21 %
Brady Statistic RV Percent Paced: 0.13 %
Date Time Interrogation Session: 20210909063601
Implantable Lead Implant Date: 20201209
Implantable Lead Implant Date: 20201209
Implantable Lead Location: 753859
Implantable Lead Location: 753860
Implantable Lead Model: 5076
Implantable Lead Model: 5076
Implantable Pulse Generator Implant Date: 20201209
Lead Channel Impedance Value: 304 Ohm
Lead Channel Impedance Value: 361 Ohm
Lead Channel Impedance Value: 380 Ohm
Lead Channel Impedance Value: 437 Ohm
Lead Channel Pacing Threshold Amplitude: 0.625 V
Lead Channel Pacing Threshold Amplitude: 0.75 V
Lead Channel Pacing Threshold Pulse Width: 0.4 ms
Lead Channel Pacing Threshold Pulse Width: 0.4 ms
Lead Channel Sensing Intrinsic Amplitude: 1.125 mV
Lead Channel Sensing Intrinsic Amplitude: 1.125 mV
Lead Channel Sensing Intrinsic Amplitude: 17.5 mV
Lead Channel Sensing Intrinsic Amplitude: 17.5 mV
Lead Channel Setting Pacing Amplitude: 1.5 V
Lead Channel Setting Pacing Amplitude: 2 V
Lead Channel Setting Pacing Pulse Width: 0.4 ms
Lead Channel Setting Sensing Sensitivity: 2.8 mV

## 2020-06-15 NOTE — Progress Notes (Signed)
Remote pacemaker transmission.   

## 2020-06-22 ENCOUNTER — Telehealth: Payer: Self-pay

## 2020-06-22 NOTE — Telephone Encounter (Signed)
Patient has relocated and been released to [facility].

## 2021-04-03 IMAGING — DX DG CHEST 1V PORT
1 series · 1 of 1 positions shown · non-contrast
Comparison: Portable exam 4646 hours compared to 08/02/2019

CLINICAL DATA: Temporary pacemaker

EXAM:
PORTABLE CHEST 1 VIEW

[view not recorded]
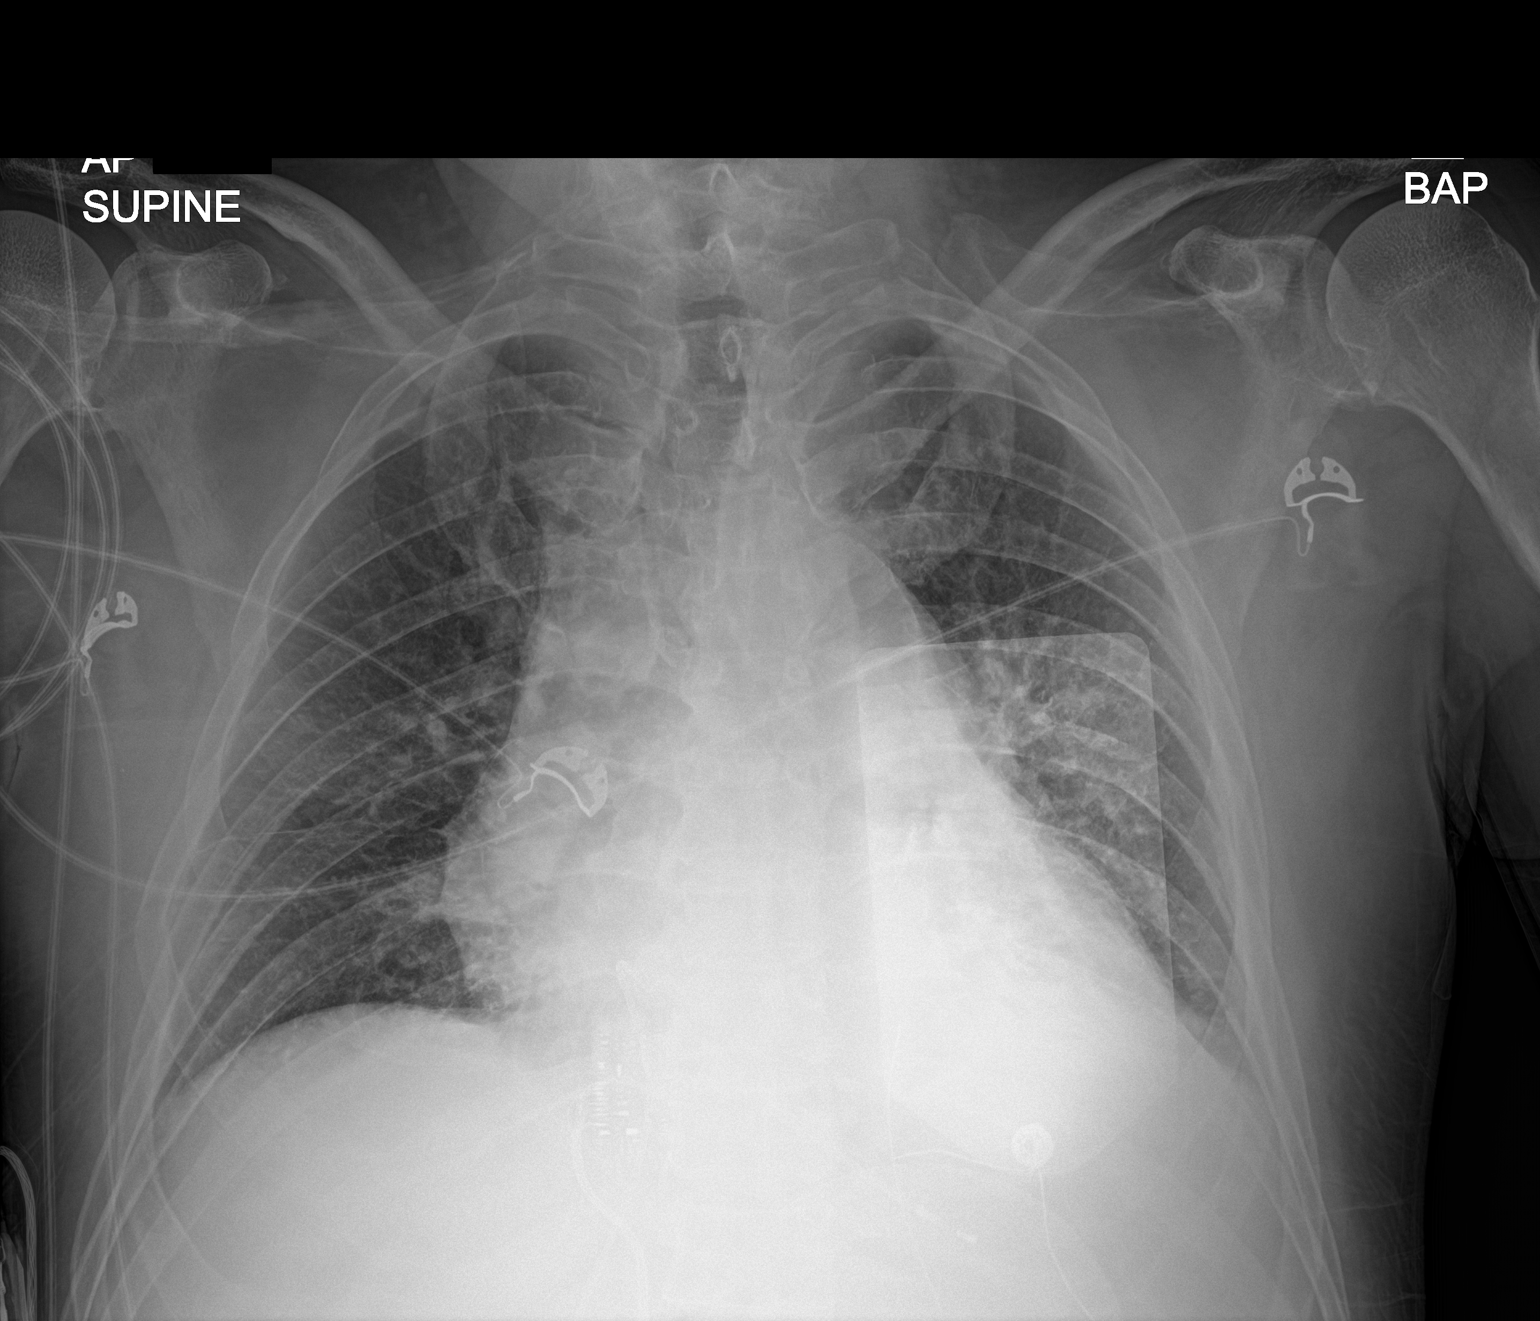

[1 of 1 positions shown; findings below may reference images not displayed]

FINDINGS: Pacemaker via infra diaphragmatic approach projects over the RIGHT
ventricle.

External pacing leads are present.

Enlargement of cardiac silhouette with pulmonary vascular
congestion.

Minimal perihilar edema greater on LEFT.

Subsegmental atelectasis at LEFT base.

No gross pleural effusion or pneumothorax.
IMPRESSION: Pacemaker lead projects over RIGHT ventricle.

Enlargement of cardiac silhouette with vascular congestion and
minimal perihilar edema.

Subsegmental atelectasis LEFT base.

## 2021-04-05 IMAGING — DX DG CHEST 1V PORT
1 series · 1 of 1 positions shown · non-contrast
Comparison: 08/04/2019.

CLINICAL DATA: Chest tube.  CABG.

EXAM:
PORTABLE CHEST 1 VIEW

[chest ap]
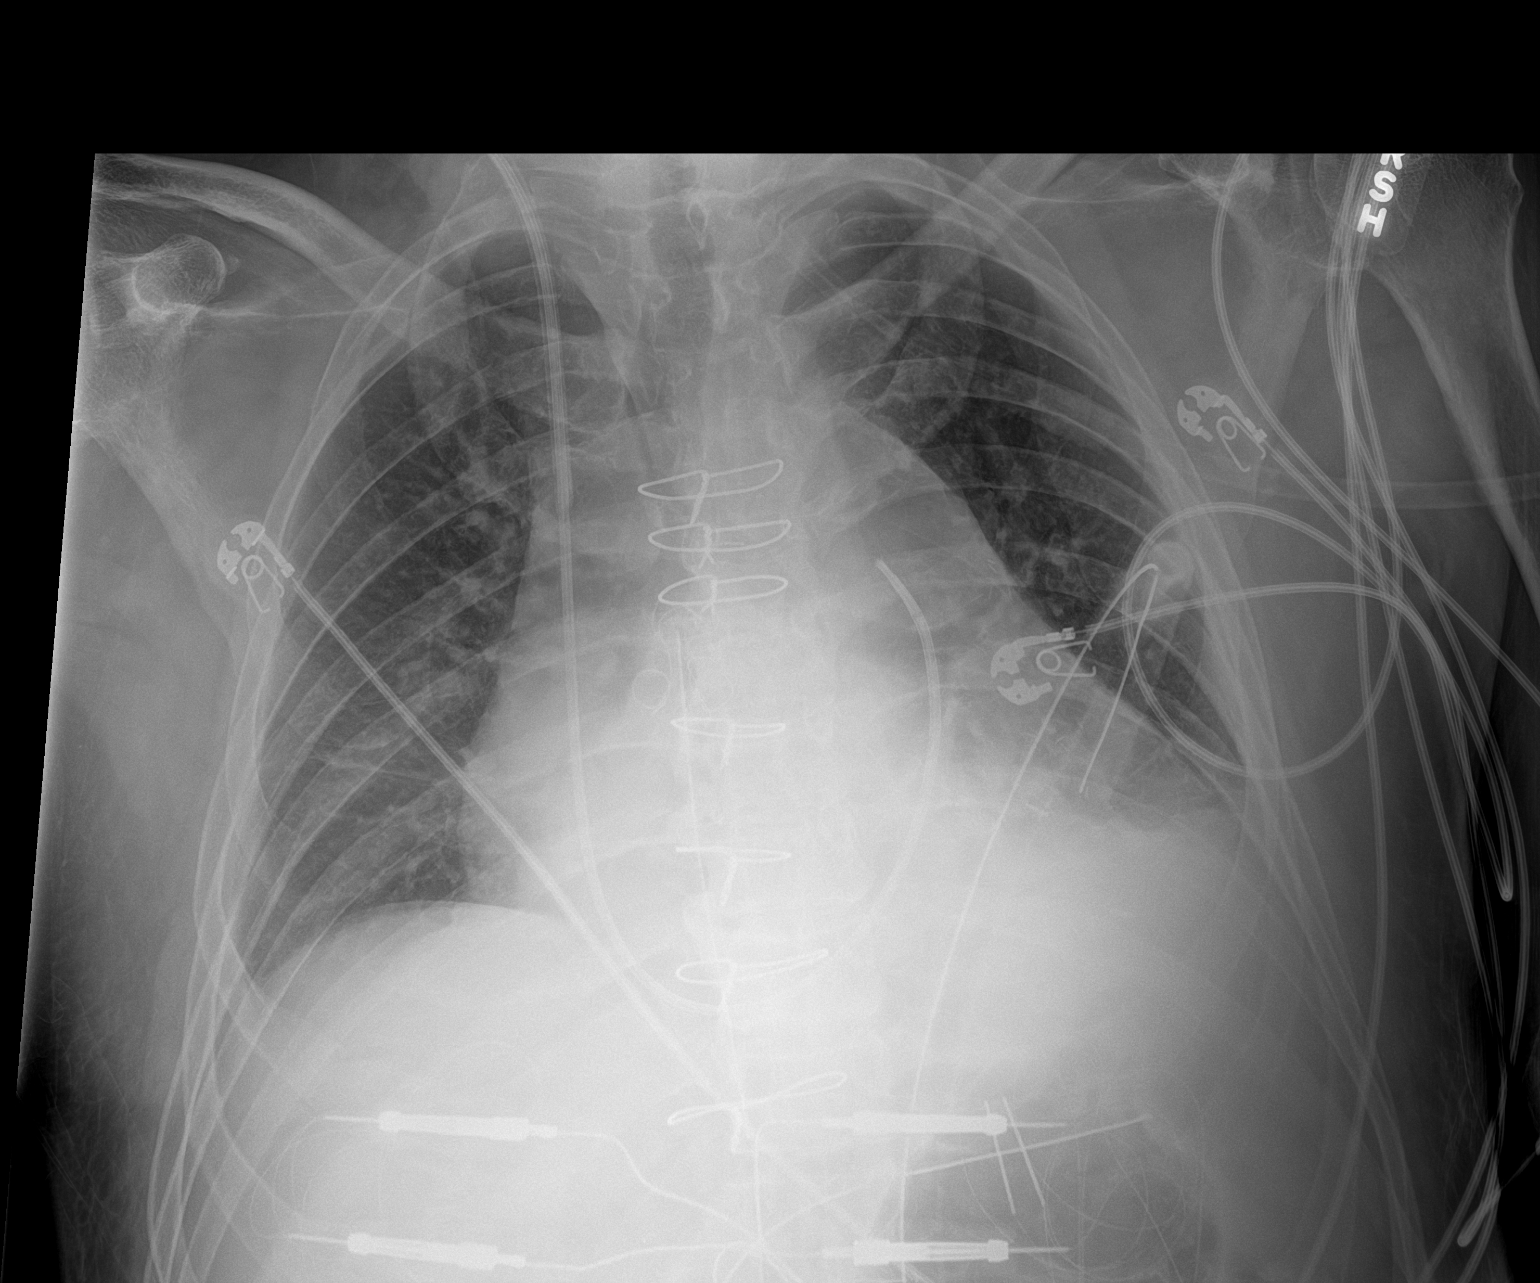

[1 of 1 positions shown; findings below may reference images not displayed]

FINDINGS: Interim removal of endotracheal tube and NG tube. Swan-Ganz
catheter, mediastinal drainage tube, and left chest tube in stable
position. No pneumothorax. Prior CABG. Stable cardiomegaly.
Persistent bibasilar atelectasis, left side greater than right. Left
base infiltrate cannot be excluded. Tiny left pleural effusion
cannot be excluded. Degenerative change thoracic spine.
IMPRESSION: 1. Interim removal of endotracheal tube and NG tube. Swan-Ganz
catheter, mediastinal drainage tube, left chest tube in stable
position. No pneumothorax.

2.  Prior CABG.  Stable cardiomegaly.

3. Persistent bibasilar atelectasis, left side greater than right.
Left base infiltrate cannot be excluded. Tiny left pleural effusion
cannot be excluded.

## 2021-04-06 IMAGING — DX DG CHEST 1V PORT
1 series · 1 of 1 positions shown · non-contrast
Comparison: 08/05/2019

CLINICAL DATA: Status post CABG on 08/04/2019.

EXAM:
PORTABLE CHEST 1 VIEW

[chest]
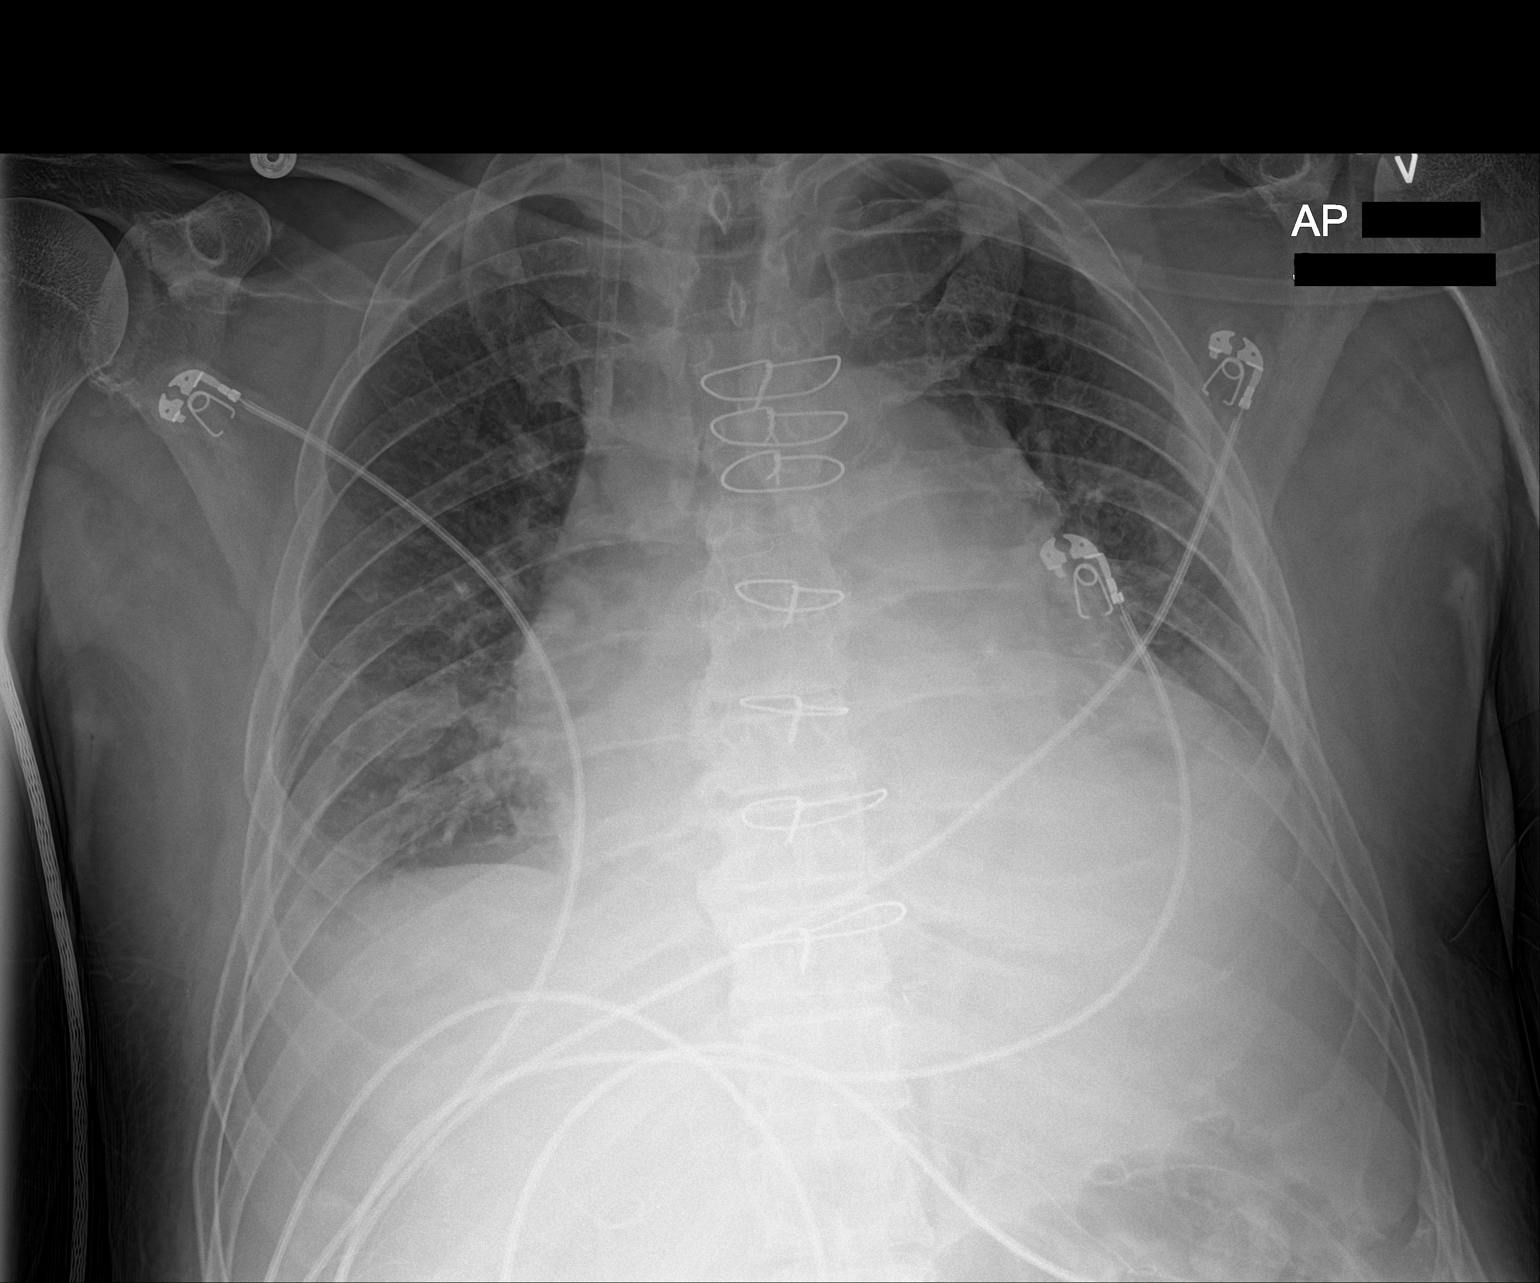

[1 of 1 positions shown; findings below may reference images not displayed]

FINDINGS: The right jugular Swan-Ganz catheter has been removed and the sheath
remains in place. The mediastinal and left chest tubes have been
removed. No pneumothorax. Stable enlarged cardiac silhouette and
post CABG changes. Interval minimal patchy opacity at the right lung
base and mildly increased patchy opacity at the left lung base.
Thoracic spine degenerative changes.
IMPRESSION: 1. Interval minimal patchy atelectasis or pneumonia at the right
lung base and mildly increased patchy atelectasis or pneumonia at
the left lung base.
2. Stable cardiomegaly.

## 2021-05-15 IMAGING — DX DG CHEST 2V
2 series · 2 of 2 positions shown · non-contrast
Comparison: August 12, 2019.

CLINICAL DATA: Status post coronary bypass graft.

EXAM:
CHEST - 2 VIEW

[dg chest 2 view (1 of 2)]
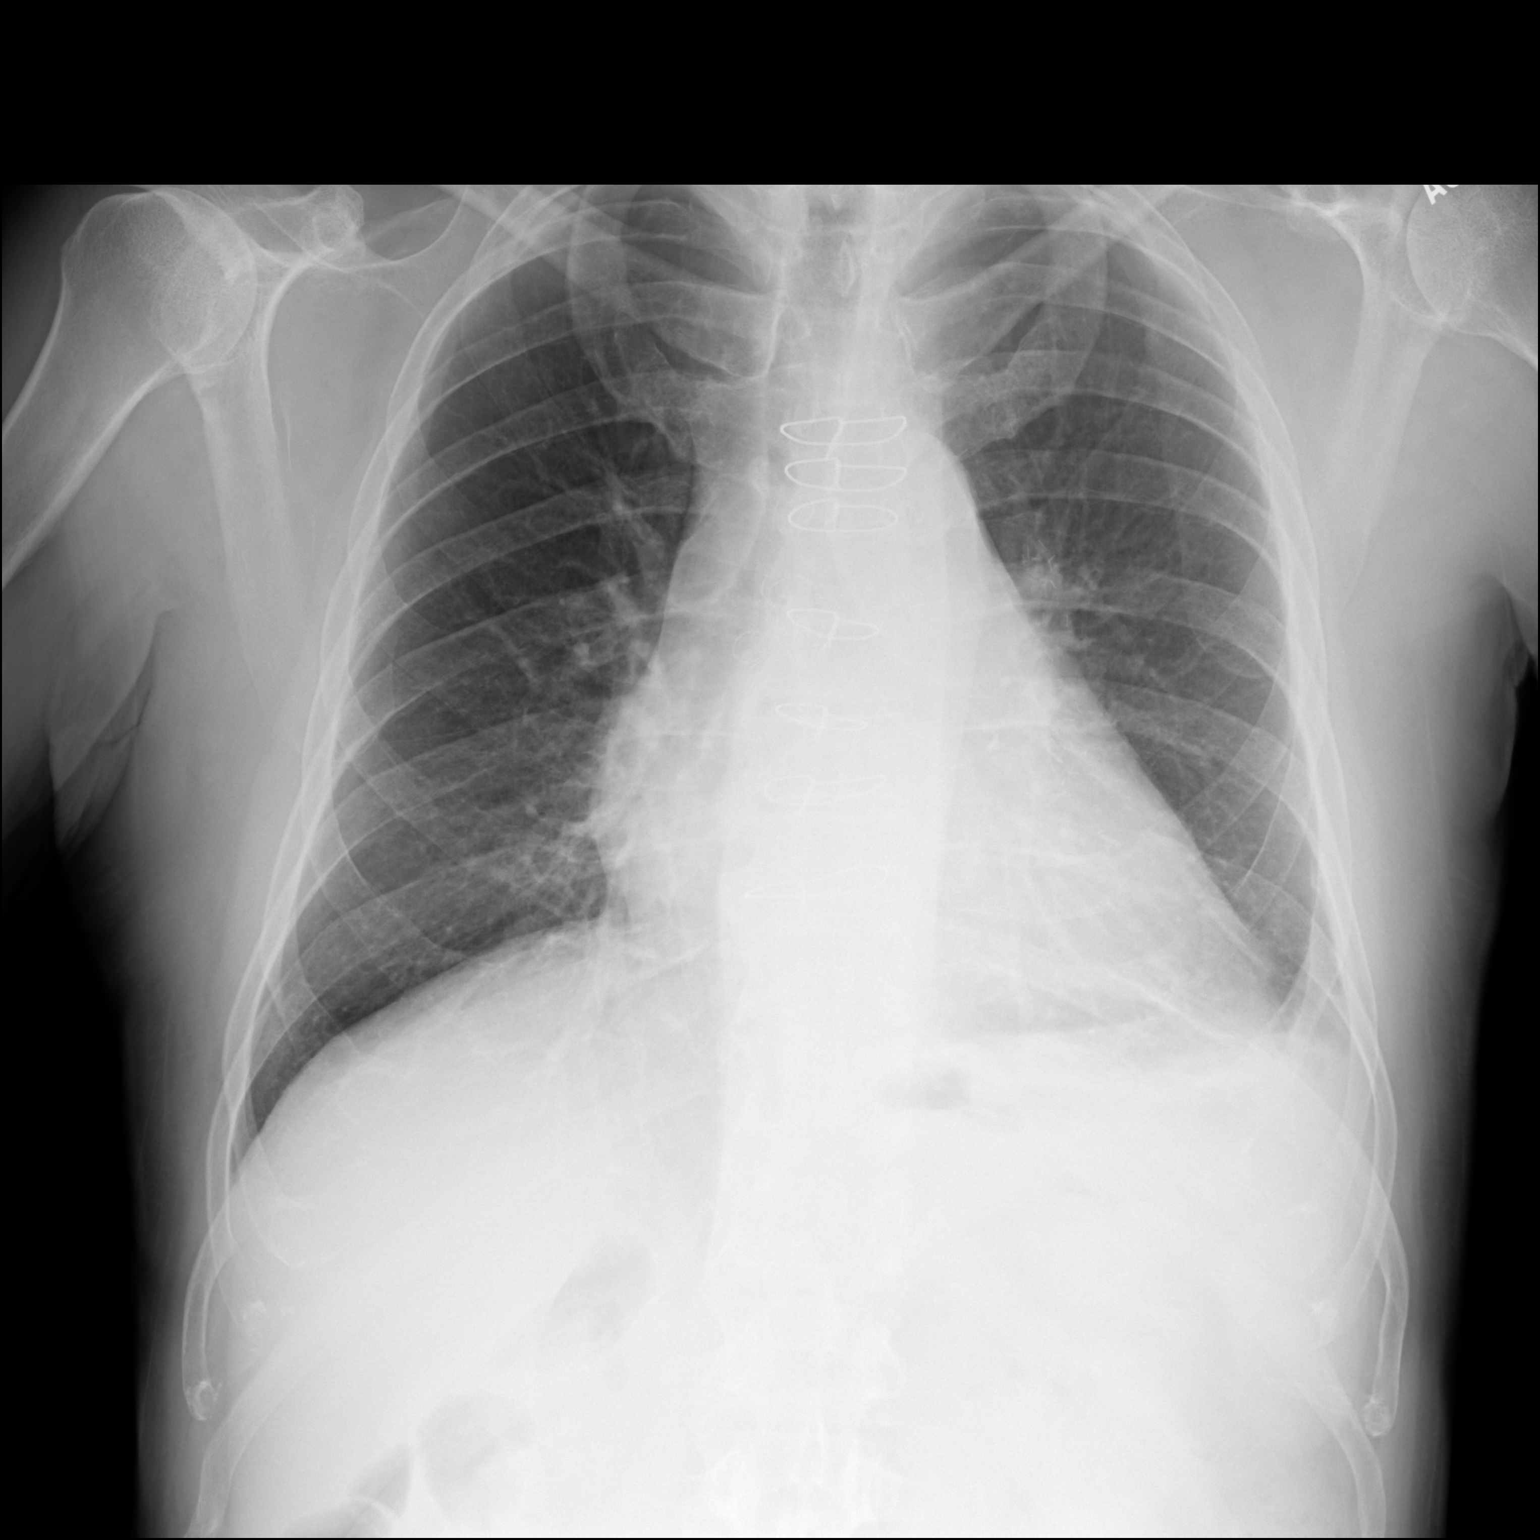

[dg chest 2 view (2 of 2)]
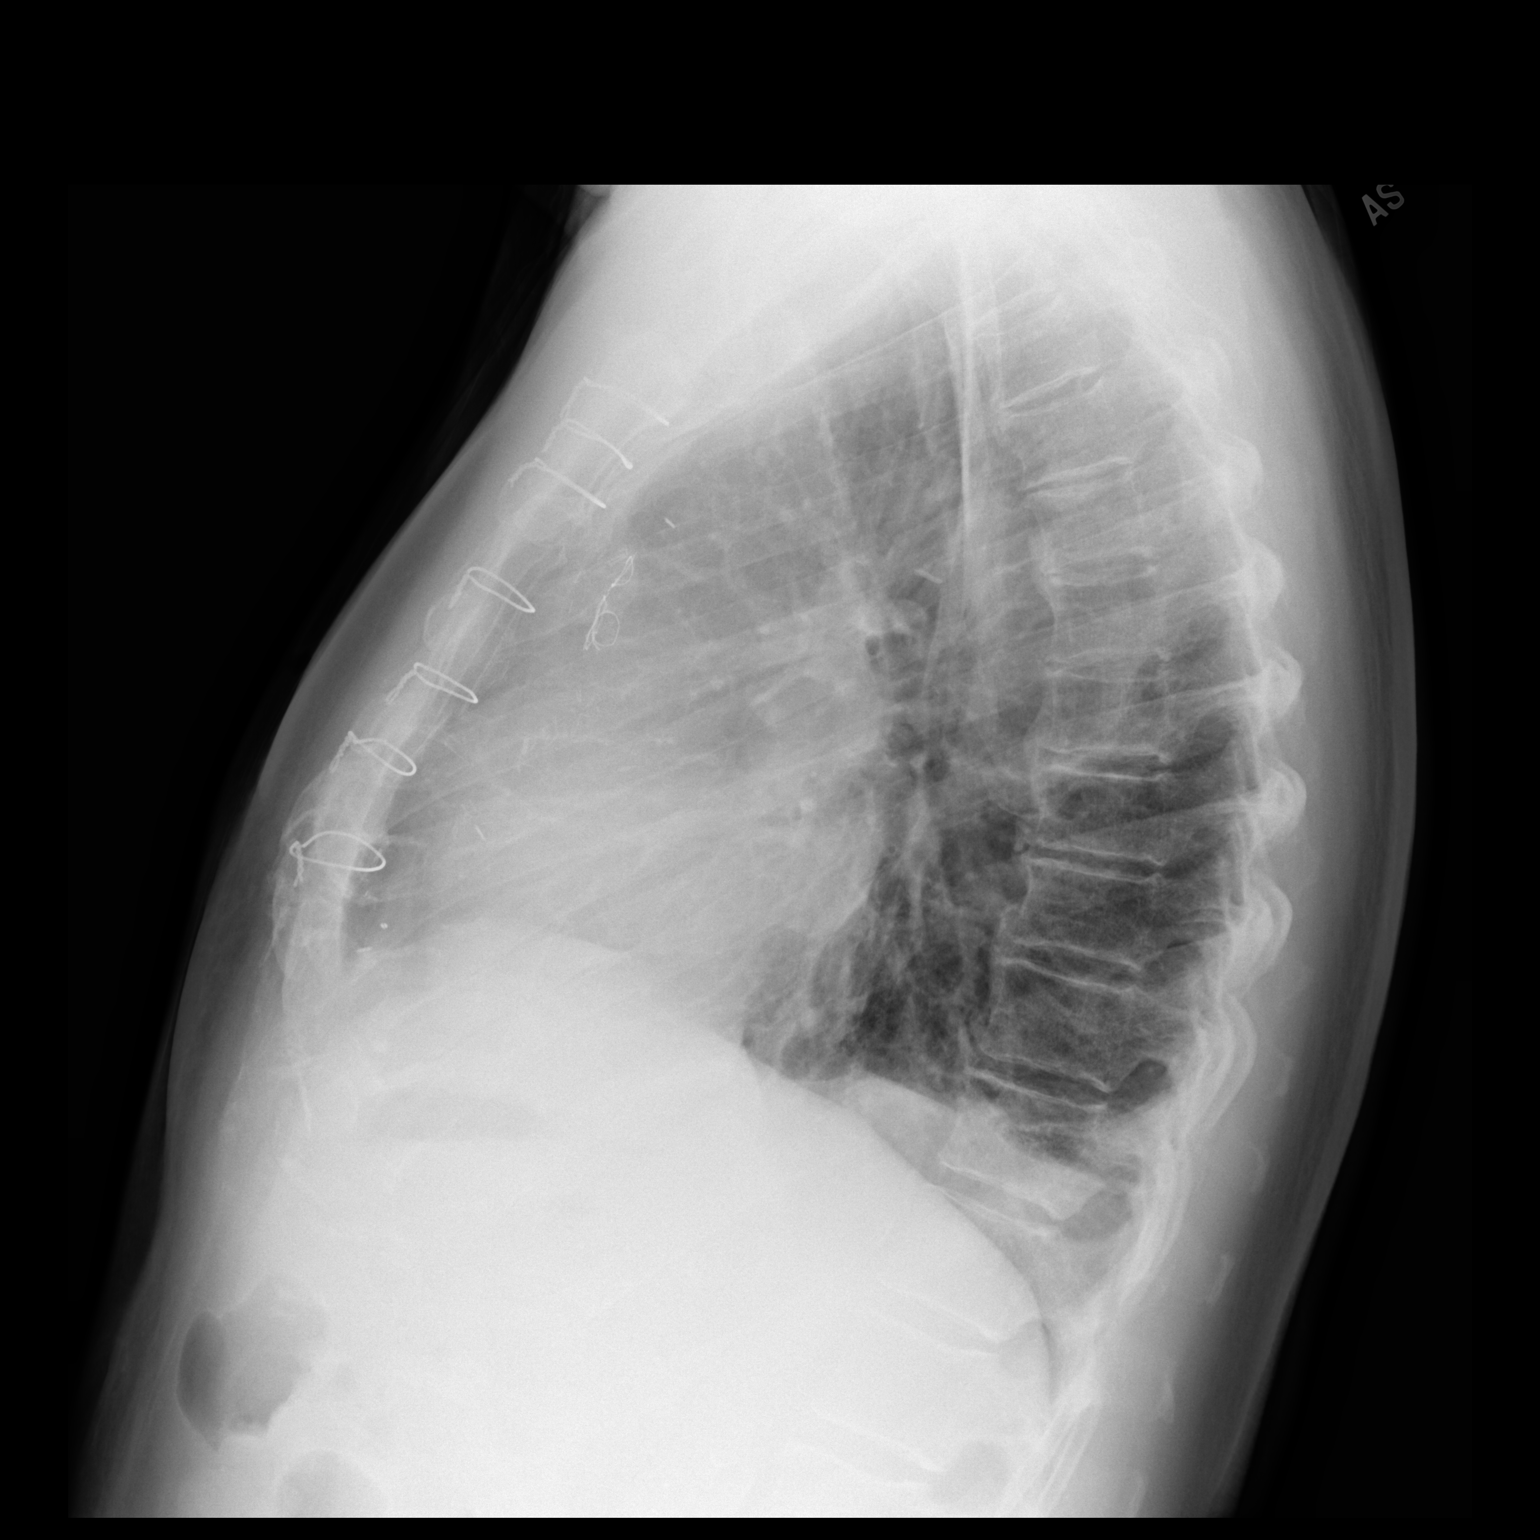

[2 of 2 positions shown; findings below may reference images not displayed]

FINDINGS: Stable cardiomediastinal silhouette. Status post coronary bypass
graft. No pneumothorax is noted. Right lung is clear. Mild left
basilar subsegmental atelectasis is noted with probable small left
pleural effusion. Bony thorax is unremarkable.
IMPRESSION: Stable mild left basilar subsegmental atelectasis with probable
small left pleural effusion. No pneumothorax is noted status post
coronary bypass graft.

## 2023-06-07 DEATH — deceased
